# Patient Record
Sex: Female | Born: 1935 | Race: White | Hispanic: No | Smoking: Never smoker
Health system: Southern US, Community
[De-identification: ages and names within clinical notes are randomized; demographics above are authoritative.]

## PROBLEM LIST (undated history)

## (undated) DIAGNOSIS — I10 Essential (primary) hypertension: Secondary | ICD-10-CM

## (undated) DIAGNOSIS — R55 Syncope and collapse: Secondary | ICD-10-CM

## (undated) DIAGNOSIS — I341 Nonrheumatic mitral (valve) prolapse: Secondary | ICD-10-CM

## (undated) DIAGNOSIS — I340 Nonrheumatic mitral (valve) insufficiency: Secondary | ICD-10-CM

## (undated) DIAGNOSIS — E785 Hyperlipidemia, unspecified: Secondary | ICD-10-CM

## (undated) DIAGNOSIS — K279 Peptic ulcer, site unspecified, unspecified as acute or chronic, without hemorrhage or perforation: Secondary | ICD-10-CM

## (undated) HISTORY — PX: CATARACT EXTRACTION: SUR2

## (undated) HISTORY — DX: Nonrheumatic mitral (valve) insufficiency: I34.0

## (undated) HISTORY — DX: Syncope and collapse: R55

## (undated) HISTORY — PX: NECK SURGERY: SHX720

## (undated) HISTORY — DX: Hyperlipidemia, unspecified: E78.5

## (undated) HISTORY — DX: Peptic ulcer, site unspecified, unspecified as acute or chronic, without hemorrhage or perforation: K27.9

## (undated) HISTORY — PX: ELBOW SURGERY: SHX618

## (undated) HISTORY — DX: Essential (primary) hypertension: I10

## (undated) HISTORY — DX: Nonrheumatic mitral (valve) prolapse: I34.1

## (undated) HISTORY — PX: FOOT SURGERY: SHX648

---

## 1997-07-02 ENCOUNTER — Other Ambulatory Visit: Admission: RE | Admit: 1997-07-02 | Discharge: 1997-07-02 | Payer: Self-pay | Admitting: *Deleted

## 1997-08-13 ENCOUNTER — Ambulatory Visit (HOSPITAL_COMMUNITY): Admission: RE | Admit: 1997-08-13 | Discharge: 1997-08-13 | Payer: Self-pay | Admitting: Gastroenterology

## 1998-01-29 ENCOUNTER — Other Ambulatory Visit: Admission: RE | Admit: 1998-01-29 | Discharge: 1998-01-29 | Payer: Self-pay | Admitting: Obstetrics and Gynecology

## 1998-04-21 ENCOUNTER — Ambulatory Visit (HOSPITAL_COMMUNITY): Admission: RE | Admit: 1998-04-21 | Discharge: 1998-04-21 | Payer: Self-pay | Admitting: Family Medicine

## 1998-04-21 ENCOUNTER — Encounter: Payer: Self-pay | Admitting: Family Medicine

## 1998-07-13 ENCOUNTER — Other Ambulatory Visit: Admission: RE | Admit: 1998-07-13 | Discharge: 1998-07-13 | Payer: Self-pay | Admitting: Obstetrics and Gynecology

## 1999-02-01 ENCOUNTER — Other Ambulatory Visit: Admission: RE | Admit: 1999-02-01 | Discharge: 1999-02-01 | Payer: Self-pay | Admitting: Obstetrics and Gynecology

## 1999-07-16 ENCOUNTER — Other Ambulatory Visit: Admission: RE | Admit: 1999-07-16 | Discharge: 1999-07-16 | Payer: Self-pay | Admitting: Obstetrics and Gynecology

## 1999-12-27 ENCOUNTER — Encounter: Admission: RE | Admit: 1999-12-27 | Discharge: 1999-12-27 | Payer: Self-pay | Admitting: Emergency Medicine

## 1999-12-27 ENCOUNTER — Encounter: Payer: Self-pay | Admitting: Emergency Medicine

## 2000-07-31 ENCOUNTER — Other Ambulatory Visit: Admission: RE | Admit: 2000-07-31 | Discharge: 2000-07-31 | Payer: Self-pay | Admitting: Obstetrics and Gynecology

## 2001-01-25 ENCOUNTER — Encounter: Payer: Self-pay | Admitting: Emergency Medicine

## 2001-01-25 ENCOUNTER — Encounter: Admission: RE | Admit: 2001-01-25 | Discharge: 2001-01-25 | Payer: Self-pay | Admitting: Emergency Medicine

## 2001-08-09 ENCOUNTER — Other Ambulatory Visit: Admission: RE | Admit: 2001-08-09 | Discharge: 2001-08-09 | Payer: Self-pay | Admitting: Obstetrics and Gynecology

## 2002-01-02 HISTORY — PX: US ECHOCARDIOGRAPHY: HXRAD669

## 2002-03-04 ENCOUNTER — Encounter: Admission: RE | Admit: 2002-03-04 | Discharge: 2002-03-04 | Payer: Self-pay | Admitting: Emergency Medicine

## 2002-03-04 ENCOUNTER — Encounter: Payer: Self-pay | Admitting: Emergency Medicine

## 2002-10-01 ENCOUNTER — Other Ambulatory Visit: Admission: RE | Admit: 2002-10-01 | Discharge: 2002-10-01 | Payer: Self-pay | Admitting: Obstetrics and Gynecology

## 2003-01-22 ENCOUNTER — Encounter (INDEPENDENT_AMBULATORY_CARE_PROVIDER_SITE_OTHER): Payer: Self-pay | Admitting: Specialist

## 2003-01-22 ENCOUNTER — Ambulatory Visit (HOSPITAL_COMMUNITY): Admission: RE | Admit: 2003-01-22 | Discharge: 2003-01-22 | Payer: Self-pay | Admitting: Gastroenterology

## 2003-05-09 ENCOUNTER — Encounter: Admission: RE | Admit: 2003-05-09 | Discharge: 2003-05-09 | Payer: Self-pay | Admitting: Emergency Medicine

## 2003-10-14 ENCOUNTER — Other Ambulatory Visit: Admission: RE | Admit: 2003-10-14 | Discharge: 2003-10-14 | Payer: Self-pay | Admitting: Obstetrics and Gynecology

## 2003-10-17 ENCOUNTER — Ambulatory Visit (HOSPITAL_COMMUNITY): Admission: RE | Admit: 2003-10-17 | Discharge: 2003-10-17 | Payer: Self-pay | Admitting: Obstetrics and Gynecology

## 2004-05-25 ENCOUNTER — Encounter: Admission: RE | Admit: 2004-05-25 | Discharge: 2004-05-25 | Payer: Self-pay | Admitting: Emergency Medicine

## 2004-11-10 ENCOUNTER — Other Ambulatory Visit: Admission: RE | Admit: 2004-11-10 | Discharge: 2004-11-10 | Payer: Self-pay | Admitting: Obstetrics and Gynecology

## 2005-05-30 ENCOUNTER — Encounter: Admission: RE | Admit: 2005-05-30 | Discharge: 2005-05-30 | Payer: Self-pay | Admitting: Emergency Medicine

## 2006-06-12 ENCOUNTER — Encounter: Admission: RE | Admit: 2006-06-12 | Discharge: 2006-06-12 | Payer: Self-pay | Admitting: Obstetrics and Gynecology

## 2007-07-12 ENCOUNTER — Encounter: Admission: RE | Admit: 2007-07-12 | Discharge: 2007-07-12 | Payer: Self-pay | Admitting: Obstetrics and Gynecology

## 2008-08-11 ENCOUNTER — Encounter: Admission: RE | Admit: 2008-08-11 | Discharge: 2008-08-11 | Payer: Self-pay | Admitting: Obstetrics and Gynecology

## 2009-09-11 ENCOUNTER — Encounter: Admission: RE | Admit: 2009-09-11 | Discharge: 2009-09-11 | Payer: Self-pay | Admitting: Endocrinology

## 2010-02-05 ENCOUNTER — Ambulatory Visit: Payer: Self-pay | Admitting: Cardiovascular Disease

## 2010-05-02 ENCOUNTER — Encounter: Payer: Self-pay | Admitting: Endocrinology

## 2010-08-06 ENCOUNTER — Telehealth: Payer: Self-pay | Admitting: *Deleted

## 2010-08-06 ENCOUNTER — Encounter: Payer: Self-pay | Admitting: Endocrinology

## 2010-08-06 ENCOUNTER — Other Ambulatory Visit (HOSPITAL_BASED_OUTPATIENT_CLINIC_OR_DEPARTMENT_OTHER): Payer: Self-pay | Admitting: Internal Medicine

## 2010-08-06 DIAGNOSIS — Z1231 Encounter for screening mammogram for malignant neoplasm of breast: Secondary | ICD-10-CM

## 2010-08-06 NOTE — Telephone Encounter (Signed)
Pt called msg left. We received EKG with hr 48, called to see how pt was feeling both numbers called and msg left to go to er if dizzy or not feeling well and to call office on Monday if need to be seen.Alfonso Ramus RN

## 2010-08-27 NOTE — Op Note (Signed)
   NAMEELISAMA, Michelle Gardner                         ACCOUNT NO.:  [id number]   MEDICAL RECORD NO.:  [id number]                   PATIENT TYPE:  AMB   LOCATION:  ENDO                                 FACILITY:  [facility]   PHYSICIAN:  Bernette Redbird, M.D.                DATE OF BIRTH:  08-03-1924   DATE OF PROCEDURE:  01/22/2003  DATE OF DISCHARGE:                                 OPERATIVE REPORT   PROCEDURE:  Colonoscopy with polypectomy.   ENDOSCOPIST:  Bernette Redbird, M.D.   INDICATION:  Family history of colon cancer in a 77 year old female.   FINDINGS:  Diminutive rectal polyps.  Pancolonic diverticulosis.   PROCEDURE:  The nature, purpose and risks of the procedure were familiar to  the patient from prior examination and she provided written consent.  Sedation was fentanyl 50 mcg and Versed 5 mg IV, without arrhythmias or  desaturation.   The Olympus adult video colonoscope was advanced with minimal difficulty  through an angulated, slightly fixated sigmoid region, where there was some  degree of muscular thickening and associated diverticulosis, around the  colon to the orifice of the terminal ileum, which was examined but not  really entered.  The ileal mucosa looked normal at the orifice.  Pullback  was then performed.  The quality of the prep was satisfactory and it was  felt that all areas were adequately seen.   There was scattered diverticulosis throughout the colon, most prominent in  the left colon, but I believe there was also some right-sided  diverticulosis, or at least deep sulci.   In the rectum were two small polyps, the larger perhaps 4 to 5 mm across and  removed by snare technique, and adjacent to it, a small hyperplastic-  appearing sessile polyp, biopsied x1.  Retroflexion in the rectum and  reinspection of the rectosigmoid were otherwise unremarkable.   No large polyps, cancer, colitis or vascular malformations were noted.   The patient tolerated the  procedure well and there were no apparent  complications.   IMPRESSION:  1. Small rectal polyps.  2. Diverticulosis.  3. Family history of colon cancer.    PLAN:  Await pathology.  Anticipate colonoscopic followup in five years, in  view of the family history, regardless of the current polyp histology.                                               Bernette Redbird, M.D.    RB/MEDQ  D:  01/22/2003  T:  01/22/2003  Job:  [id number]   cc:   Brett Canales A. Cleta Alberts, M.D.  [address]  [place]  [place] [address]  Fax: [phone]

## 2010-09-20 ENCOUNTER — Ambulatory Visit
Admission: RE | Admit: 2010-09-20 | Discharge: 2010-09-20 | Disposition: A | Discharge status: Home / Self Care | Payer: Medicare HMO | Source: Ambulatory Visit | Attending: Internal Medicine | Admitting: Internal Medicine

## 2010-09-20 DIAGNOSIS — Z1231 Encounter for screening mammogram for malignant neoplasm of breast: Secondary | ICD-10-CM

## 2011-07-15 ENCOUNTER — Encounter: Payer: Self-pay | Admitting: *Deleted

## 2011-07-25 ENCOUNTER — Ambulatory Visit (INDEPENDENT_AMBULATORY_CARE_PROVIDER_SITE_OTHER): Payer: Medicare HMO | Admitting: Cardiovascular Disease

## 2011-07-25 ENCOUNTER — Encounter: Payer: Self-pay | Admitting: Cardiovascular Disease

## 2011-07-25 VITALS — BP 125/60 | HR 48 | Ht 67.0 in | Wt 147.8 lb

## 2011-07-25 DIAGNOSIS — I341 Nonrheumatic mitral (valve) prolapse: Secondary | ICD-10-CM

## 2011-07-25 DIAGNOSIS — I10 Essential (primary) hypertension: Secondary | ICD-10-CM

## 2011-07-25 DIAGNOSIS — I059 Rheumatic mitral valve disease, unspecified: Secondary | ICD-10-CM

## 2011-07-25 MED ORDER — TRIAMTERENE-HCTZ 37.5-25 MG PO TABS: 0.5000 | ORAL_TABLET | Freq: Every day | ORAL | Status: DC

## 2011-07-25 NOTE — Assessment & Plan Note (Signed)
Michelle Gardner's blood pressure remains mildly elevated. It may be because she's a little uptight coming into the office today. I retook her blood pressure is actually fairly normal.  She was previously on Maxzide-one half tablet a day. We'll change her  from HCTZ back to Maxzide.  LC her again in 3 months for followup office visit and basic metabolic profile

## 2011-07-25 NOTE — Patient Instructions (Signed)
Your physician recommends that you schedule a follow-up appointment in: 3 months  Your physician has recommended you make the following change in your medication:   STOP HCTZ START MAXZIDE 25 MG/37.5 MG TAKE 1/2 TABLET DAILY.

## 2011-07-25 NOTE — Assessment & Plan Note (Signed)
Stable

## 2011-07-25 NOTE — Progress Notes (Signed)
    Michelle Gardner Date of Birth  [birth year] [facility]     [place] [facility]  [address]    [address]   [address] [place], [place]  [address]    [place], [place]  [address] [phone]  Fax  [phone]  [phone]  Fax [phone]  Problem LIst: 1. Hypertension 2. Mitral valve prolapse 3. S/p radioactive iodine   History of Present Illness:  Michelle Gardner is doing well. She has a history of hypertension and mild mitral prolapse. She's been able to do all of her normal activities without any significant problems.  Current Outpatient Prescriptions  Medication Sig Dispense Refill  . aspirin 81 MG tablet Take 81 mg by mouth daily.      . Calcium Carbonate Antacid (TUMS PO) Take by mouth daily.      . IBUPROFEN PO Take by mouth as needed.      . Multiple Vitamin (MULTIVITAMIN) capsule Take 1 capsule by mouth daily.      [name] triamterene-hydrochlorothiazide (MAXZIDE-25) 37.5-25 MG per tablet Take 0.5 each (0.5 tablets total) by mouth daily.  30 tablet  5     Allergies  Allergen Reactions  . Codeine     Past Medical History  Diagnosis Date  . HTN (hypertension)   . MVP (mitral valve prolapse)   . Mitral regurgitation   . Peptic ulcer     Past Surgical History  Procedure Date  . Neck surgery   . Elbow surgery     Wired  . Foot surgery     Cyst  . US echocardiography 01-02-2002    EF 65-70%    History  Smoking status  . Never Smoker   Smokeless tobacco  . Not on file    History  Alcohol Use: Not on file    No family history on file.  Reviw of Systems:  Reviewed in the HPI.  All other systems are negative.  Physical Exam: Blood pressure 157/78, pulse 48, height 5\' 7"  (1.702 m), weight 147 lb 12.8 oz (67.042 kg). General: Well developed, well nourished, in no acute distress.  Head: Normocephalic, atraumatic, sclera non-icteric, mucus membranes are moist,   Neck: Supple. Carotids are 2 + without bruits. No JVD  Lungs: Clear  bilaterally to auscultation.  Heart: regular rate.  normal  S1 S2. There is a soft systolic murmur at the LSB.  Abdomen: Soft, non-tender, non-distended with normal bowel sounds. No hepatomegaly. No rebound/guarding. No masses.  Msk:  Strength and tone are normal  Extremities: No clubbing or cyanosis. No edema.  Distal pedal pulses are 2+ and equal bilaterally.  Neuro: Alert and oriented X 3. Moves all extremities spontaneously.  Psych:  Responds to questions appropriately with a normal affect.  ECG:  Assessment / Plan:

## 2011-09-28 ENCOUNTER — Other Ambulatory Visit: Payer: Self-pay | Admitting: Internal Medicine

## 2011-09-28 DIAGNOSIS — Z1231 Encounter for screening mammogram for malignant neoplasm of breast: Secondary | ICD-10-CM

## 2011-10-18 ENCOUNTER — Ambulatory Visit: Payer: MEDICARE

## 2011-10-19 ENCOUNTER — Ambulatory Visit
Admission: RE | Admit: 2011-10-19 | Discharge: 2011-10-19 | Disposition: A | Discharge status: Home / Self Care | Payer: Medicare Other | Source: Ambulatory Visit | Attending: Internal Medicine | Admitting: Internal Medicine

## 2011-10-19 DIAGNOSIS — Z1231 Encounter for screening mammogram for malignant neoplasm of breast: Secondary | ICD-10-CM

## 2011-10-26 ENCOUNTER — Encounter: Payer: Self-pay | Admitting: Cardiovascular Disease

## 2011-10-26 ENCOUNTER — Ambulatory Visit (INDEPENDENT_AMBULATORY_CARE_PROVIDER_SITE_OTHER): Payer: Medicare Other | Admitting: Cardiovascular Disease

## 2011-10-26 VITALS — BP 140/70 | HR 49 | Ht 67.0 in | Wt 145.8 lb

## 2011-10-26 DIAGNOSIS — I10 Essential (primary) hypertension: Secondary | ICD-10-CM

## 2011-10-26 LAB — BASIC METABOLIC PANEL
BUN: 14 mg/dL (ref 6–23)
Chloride: 100 mEq/L (ref 96–112)
GFR: 60.09 mL/min (ref >60.00)
Glucose, Bld: 113 mg/dL — ABNORMAL HIGH (ref 70–99)
Potassium: 3.2 mEq/L — ABNORMAL LOW (ref 3.5–5.1)
Sodium: 139 mEq/L (ref 135–145)

## 2011-10-26 NOTE — Progress Notes (Signed)
    Michelle Gardner Date of Birth  [birth year] [facility]     [place] Office  [address]    [address]   [address] [place], [place]  [address]    [place], [place]  [address] [phone]  Fax  [phone]  [phone]  Fax [phone]  Problem LIst: 1. Hypertension 2. Mitral valve prolapse 3. S/p radioactive iodine  4. Sinus Bradycardia - chronic  History of Present Illness:  [name] is doing well. She has a history of hypertension and mild mitral prolapse. She's been able to do all of her normal activities without any significant problems.  Current Outpatient Prescriptions  Medication Sig Dispense Refill  . aspirin 81 MG tablet Take 81 mg by mouth daily.      . Calcium Carbonate Antacid (TUMS PO) Take by mouth daily.      . IBUPROFEN PO Take by mouth as needed.      . Multiple Vitamin (MULTIVITAMIN) capsule Take 1 capsule by mouth daily.      [name] triamterene-hydrochlorothiazide (MAXZIDE-25) 37.5-25 MG per tablet Take 0.5 each (0.5 tablets total) by mouth daily.  30 tablet  5     Allergies  Allergen Reactions  . Codeine     Past Medical History  Diagnosis Date  . HTN (hypertension)   . MVP (mitral valve prolapse)   . Mitral regurgitation   . Peptic ulcer     Past Surgical History  Procedure Date  . Neck surgery   . Elbow surgery     Wired  . Foot surgery     Cyst  . US echocardiography 01-02-2002    EF 65-70%    History  Smoking status  . Never Smoker   Smokeless tobacco  . Not on file    History  Alcohol Use: Not on file    No family history on file.  Reviw of Systems:  Reviewed in the HPI.  All other systems are negative.  Physical Exam: Blood pressure 140/70, pulse 49, height 5\' 7"  (1.702 m), weight 145 lb 12.8 oz (66.134 kg). General: Well developed, well nourished, in no acute distress.  Head: Normocephalic, atraumatic, sclera non-icteric, mucus membranes are moist,   Neck: Supple. Carotids are 2 + without bruits.  No JVD  Lungs: Clear bilaterally to auscultation.  Heart: regular rate.  normal  S1 S2. There is a soft systolic murmur at the LSB.  Abdomen: Soft, non-tender, non-distended with normal bowel sounds. No hepatomegaly. No rebound/guarding. No masses.  Msk:  Strength and tone are normal  Extremities: No clubbing or cyanosis. No edema.  Distal pedal pulses are 2+ and equal bilaterally.  Neuro: Alert and oriented X 3. Moves all extremities spontaneously.  Psych:  Responds to questions appropriately with a normal affect.  ECG: 10/26/2011 Sinus bradycardia with a first-degree AV block. Her heart rate is 49. She has nonspecific ST and T wave changes. Assessment / Plan:

## 2011-10-26 NOTE — Patient Instructions (Signed)
Your physician recommends that you return for lab today, bmet drawn due to med changes from last office visit.  Your physician wants you to follow-up in: 6 months You will receive a reminder letter in the mail two months in advance. If you don't receive a letter, please call our office to schedule the follow-up appointment.

## 2011-10-26 NOTE — Assessment & Plan Note (Signed)
Michelle Gardner is doing fairly well. Her blood pressure is well-controlled.  She's quite bradycardic but she's not on any medications to cause bradycardia. She's not had any episodes of syncope or presyncope. I told her that if she experiences any presyncope to call me right away. She may need a pacemaker at some point.  I'll see her again in 6 months for followup office visit.

## 2011-10-27 ENCOUNTER — Telehealth: Payer: Self-pay | Admitting: *Deleted

## 2011-10-27 DIAGNOSIS — E876 Hypokalemia: Secondary | ICD-10-CM

## 2011-10-27 MED ORDER — POTASSIUM CHLORIDE CRYS ER 10 MEQ PO TBCR: 10.0000 meq | EXTENDED_RELEASE_TABLET | Freq: Two times a day (BID) | ORAL | Status: DC

## 2011-10-27 NOTE — Telephone Encounter (Signed)
kdur added to mar pt will start today/ 1 month lab draw date set. Pt verbalized understanding of lab work/K+.

## 2011-10-27 NOTE — Telephone Encounter (Signed)
Message copied by Antony Odea on Thu Oct 27, 2011  9:46 AM ------      Message from: Vesta Mixer      Created: Wed Oct 26, 2011  6:43 PM       She is on a potassium sparing diuretic.  I would prefer that she increase her potassium in her diet.  If she is already eating a fair amount of potassium, add Kdur 10 qd.  Recheck in 1 month.

## 2011-11-23 ENCOUNTER — Other Ambulatory Visit: Payer: Medicare Other

## 2012-05-21 ENCOUNTER — Other Ambulatory Visit: Payer: Self-pay | Admitting: *Deleted

## 2012-05-21 DIAGNOSIS — I10 Essential (primary) hypertension: Secondary | ICD-10-CM

## 2012-05-21 NOTE — Telephone Encounter (Signed)
Opened in Error.

## 2012-05-22 ENCOUNTER — Other Ambulatory Visit: Payer: Self-pay | Admitting: *Deleted

## 2012-05-22 DIAGNOSIS — I10 Essential (primary) hypertension: Secondary | ICD-10-CM

## 2012-05-22 MED ORDER — TRIAMTERENE-HCTZ 37.5-25 MG PO TABS: 0.5000 | ORAL_TABLET | Freq: Every day | ORAL | Status: DC

## 2012-05-22 NOTE — Telephone Encounter (Signed)
Last seen in office 10-2011 with labs. Fax Received. Refill Completed. Nacole Fluhr Chowoe (R.M.A)

## 2012-07-11 ENCOUNTER — Other Ambulatory Visit: Payer: Self-pay | Admitting: *Deleted

## 2012-07-11 MED ORDER — POTASSIUM CHLORIDE CRYS ER 10 MEQ PO TBCR: 10.0000 meq | EXTENDED_RELEASE_TABLET | Freq: Two times a day (BID) | ORAL | Status: DC

## 2012-07-11 NOTE — Telephone Encounter (Signed)
NEED APPOINTMENT Fax Received. Refill Completed. Abrial Arrighi Chowoe (R.M.A)   

## 2012-11-27 ENCOUNTER — Other Ambulatory Visit: Payer: Self-pay

## 2012-11-27 DIAGNOSIS — Z1231 Encounter for screening mammogram for malignant neoplasm of breast: Secondary | ICD-10-CM

## 2013-01-02 ENCOUNTER — Ambulatory Visit
Admission: RE | Admit: 2013-01-02 | Discharge: 2013-01-02 | Disposition: A | Discharge status: Home / Self Care | Payer: Medicare Other | Source: Ambulatory Visit

## 2013-01-02 DIAGNOSIS — Z1231 Encounter for screening mammogram for malignant neoplasm of breast: Secondary | ICD-10-CM

## 2013-01-08 ENCOUNTER — Other Ambulatory Visit: Payer: Self-pay | Admitting: *Deleted

## 2013-01-14 ENCOUNTER — Other Ambulatory Visit: Payer: Self-pay | Admitting: *Deleted

## 2013-01-14 ENCOUNTER — Other Ambulatory Visit: Payer: Self-pay | Admitting: Endocrinology

## 2013-01-14 ENCOUNTER — Other Ambulatory Visit: Payer: Medicare Other

## 2013-01-14 DIAGNOSIS — E039 Hypothyroidism, unspecified: Secondary | ICD-10-CM

## 2013-01-14 DIAGNOSIS — E042 Nontoxic multinodular goiter: Secondary | ICD-10-CM | POA: Insufficient documentation

## 2013-01-14 LAB — TSH: TSH: 2.405 u[IU]/mL (ref 0.350–4.500)

## 2013-01-16 ENCOUNTER — Encounter: Payer: Self-pay | Admitting: Endocrinology

## 2013-01-16 ENCOUNTER — Ambulatory Visit (INDEPENDENT_AMBULATORY_CARE_PROVIDER_SITE_OTHER): Payer: Medicare Other | Admitting: Endocrinology

## 2013-01-16 VITALS — BP 140/70 | HR 60 | Temp 98.5°F | Resp 12 | Ht 67.0 in | Wt 147.8 lb

## 2013-01-16 DIAGNOSIS — E042 Nontoxic multinodular goiter: Secondary | ICD-10-CM

## 2013-01-16 NOTE — Progress Notes (Addendum)
Patient ID: SAHAANA WEITMAN, female   DOB: 10/14/1924, 79 y.o.   MRN: [id number]    Reason for Appointment: Goiter, followup    History of Present Illness:   The patient's thyroid enlargement was first discovered in 2005 She had subclinical hyperthyroidism associated with this and was treated with 29 mCi of I-131 Subsequently her goiter has been smaller in size and she has had normal thyroid functions  She has had no difficulty with swallowing. No choking sensation in her neck or pressure in any position or when lying down.  Lab Results  Component Value Date   TSH 2.405 01/14/2013   Orders Only on 01/14/2013  Component Date Value Range Status  . TSH 01/14/2013 2.405  0.350 - 4.500 uIU/mL Final  . Free T4 01/14/2013 1.03  0.80 - 1.80 ng/dL Final   HYPERCALCEMIA: Her highest calcium has been 10.7 associated with a PTH level of 31, normal bone density and no history of kidney stones She is taking vitamin D3 1000 units, has baseline vitamin D level of 29. Also taking calcium plus vitamin D supplement Last calcium was 10.2  HYPERTENSION: Mild and well controlled with Maxzide half tablet      Medication List       This list is accurate as of: 01/16/13 11:37 AM.  Always use your most recent med list.               aspirin 81 MG tablet  Take 81 mg by mouth daily.     IBUPROFEN PO  Take by mouth as needed.     multivitamin capsule  Take 1 capsule by mouth daily.     potassium chloride 10 MEQ tablet  Commonly known as:  K-DUR,KLOR-CON  Take 1 tablet (10 mEq total) by mouth 2 (two) times daily.     triamterene-hydrochlorothiazide 37.5-25 MG per tablet  Commonly known as:  MAXZIDE-25  Take 0.5 each (0.5 tablets total) by mouth daily.     TUMS PO  Take by mouth daily.        Allergies:  Allergies  Allergen Reactions  . Codeine     Past Medical History  Diagnosis Date  . HTN (hypertension)   . MVP (mitral valve prolapse)   . Mitral regurgitation   . Peptic  ulcer     Past Surgical History  Procedure Laterality Date  . Neck surgery    . Elbow surgery      Wired  . Foot surgery      Cyst  . US echocardiography  01-02-2002    EF 65-70%    No family history on file.  Social History:  reports that she has never smoked. She does not have any smokeless tobacco history on file. Her alcohol and drug histories are not on file.   Review of Systems:      ENDOCRINOLOGY:  no history of Diabetes.             Examination:   BP 140/70  Pulse 60  Temp(Src) 98.5 F (36.9 C)  Resp 12  Ht 5\' 7"  (1.702 m)  Wt 147 lb 12.8 oz (67.042 kg)  BMI 23.14 kg/m2  SpO2 96%   General Appearance: pleasant, average built and nourished          Eyes: No abnormal prominence or eyelid swelling.          Neck: The thyroid is nonpalpable Neurological: REFLEXES: at biceps are normal.        Assessment/Plan:  Multinodular  goiter, this appears to be clinically much smaller than at initial diagnosis and she continues to be euthyroid Will need annual followup  HYPERCALCEMIA likely to be from mild hyperparathyroidism, had a normal level on her last visit in April. She has had lab work from PCP in August, will get reports  Will monitor her calcium periodically, to continue vitamin D supplement  Legna Mausolf 01/09/2013, 11:25 AM

## 2013-07-15 ENCOUNTER — Other Ambulatory Visit: Payer: Medicare Other

## 2013-07-17 ENCOUNTER — Ambulatory Visit: Payer: Medicare Other | Admitting: Endocrinology

## 2013-07-30 ENCOUNTER — Other Ambulatory Visit (INDEPENDENT_AMBULATORY_CARE_PROVIDER_SITE_OTHER): Payer: Medicare HMO

## 2013-07-30 DIAGNOSIS — E039 Hypothyroidism, unspecified: Secondary | ICD-10-CM

## 2013-07-30 LAB — T4, FREE: Free T4: 0.77 ng/dL (ref 0.60–1.60)

## 2013-07-30 LAB — BASIC METABOLIC PANEL
BUN: 13 mg/dL (ref 6–23)
CO2: 31 mEq/L (ref 19–32)
CREATININE: 1 mg/dL (ref 0.4–1.2)
Calcium: 9.8 mg/dL (ref 8.4–10.5)
Chloride: 103 mEq/L (ref 96–112)
GFR: 57.71 mL/min — AB (ref >60.00)
GLUCOSE: 92 mg/dL (ref 70–99)
POTASSIUM: 3.7 meq/L (ref 3.5–5.1)
Sodium: 139 mEq/L (ref 135–145)

## 2013-07-30 LAB — TSH: TSH: 1.96 u[IU]/mL (ref 0.35–5.50)

## 2013-08-02 ENCOUNTER — Ambulatory Visit (INDEPENDENT_AMBULATORY_CARE_PROVIDER_SITE_OTHER): Payer: Medicare HMO | Admitting: Endocrinology

## 2013-08-02 ENCOUNTER — Encounter: Payer: Self-pay | Admitting: Endocrinology

## 2013-08-02 VITALS — BP 132/70 | HR 51 | Temp 98.1°F | Resp 14 | Ht 68.0 in | Wt 155.0 lb

## 2013-08-02 DIAGNOSIS — E042 Nontoxic multinodular goiter: Secondary | ICD-10-CM

## 2013-08-02 NOTE — Progress Notes (Signed)
Patient ID: Michelle Gardner, female   DOB: [birth year], 80 y.o.   MRN: [id number]    Reason for Appointment: Goiter, followup    History of Present Illness:   The patient's thyroid enlargement was first discovered in 2005 She had subclinical hyperthyroidism associated with this and was treated with 29 mCi of I-131 Subsequently her goiter has been smaller in size and she has had normal thyroid functions  She has had no difficulty with swallowing. No choking sensation in her neck or pressure in any position or when lying down.  Lab Results  Component Value Date   TSH 1.96 07/30/2013    HYPERCALCEMIA: Her highest calcium has been 10.7 associated with a PTH level of 31, normal bone density and no history of kidney stones She is taking vitamin D3 1000 units, has baseline vitamin D level of 29. Also taking calcium plus vitamin D supplement Last calcium was 10.0 in 8/14  HYPERTENSION: Mild and well controlled with Maxzide half tablet  LABS:  Appointment on 07/30/2013  Component Date Value Ref Range Status  . TSH 07/30/2013 1.96  0.35 - 5.50 uIU/mL Final  . Free T4 07/30/2013 0.77  0.60 - 1.60 ng/dL Final  . Sodium 07/30/2013 139  135 - 145 mEq/L Final  . Potassium 07/30/2013 3.7  3.5 - 5.1 mEq/L Final  . Chloride 07/30/2013 103  96 - 112 mEq/L Final  . CO2 07/30/2013 31  19 - 32 mEq/L Final  . Glucose, Bld 07/30/2013 92  70 - 99 mg/dL Final  . BUN 07/30/2013 13  6 - 23 mg/dL Final  . Creatinine, Ser 07/30/2013 1.0  0.4 - 1.2 mg/dL Final  . Calcium 07/30/2013 9.8  8.4 - 10.5 mg/dL Final  . GFR 07/30/2013 57.71* >60.00 mL/min Final        Medication List       This list is accurate as of: 08/02/13  1:09 PM.  Always use your most recent med list.               amLODipine 2.5 MG tablet  Commonly known as:  NORVASC     aspirin 81 MG tablet  Take 81 mg by mouth daily.     IBUPROFEN PO  Take by mouth as needed.     multivitamin capsule  Take 1 capsule by mouth daily.      potassium chloride 10 MEQ tablet  Commonly known as:  K-DUR,KLOR-CON  Take 1 tablet (10 mEq total) by mouth 2 (two) times daily.     potassium chloride 10 MEQ tablet  Commonly known as:  K-DUR     triamterene-hydrochlorothiazide 37.5-25 MG per tablet  Commonly known as:  MAXZIDE-25  Take 0.5 each (0.5 tablets total) by mouth daily.     TUMS PO  Take by mouth daily.        Allergies:  Allergies  Allergen Reactions  . Codeine     Past Medical History  Diagnosis Date  . HTN (hypertension)   . MVP (mitral valve prolapse)   . Mitral regurgitation   . Peptic ulcer     Past Surgical History  Procedure Laterality Date  . Neck surgery    . Elbow surgery      Wired  . Foot surgery      Cyst  . US echocardiography  01-02-2002    EF 65-70%    No family history on file.  Social History:  reports that she has never smoked. She does not have any smokeless  tobacco history on file. Her alcohol and drug histories are not on file.   Review of Systems:      ENDOCRINOLOGY:  no history of Diabetes.      Wt Readings from Last 3 Encounters:  08/02/13 155 lb (70.308 kg)  01/16/13 147 lb 12.8 oz (67.042 kg)  10/26/11 145 lb 12.8 oz (66.134 kg)           Examination:   BP 132/70  Pulse 51  Temp(Src) 98.1 F (36.7 C)  Resp 14  Ht 5\' 8"  (1.727 m)  Wt 155 lb (70.308 kg)  BMI 23.57 kg/m2  SpO2 97%   General Appearance: pleasant, average built and nourished          Eyes: No abnormal prominence or eyelid swelling.          Neck: The thyroid is not palpable Neurological: REFLEXES: at biceps are normal.        Assessment/Plan:  Multinodular goiter, this is now not palpable compared to when it was found on examination at diagnosis and she continues to be euthyroid with normal TSH No recurrence of subclinical hyperthyroidism Will need annual followup  HYPERCALCEMIA likely to be from mild hyperparathyroidism. She has not had any history of posterior anemia or  osteoporosis The calcium is now 9.8 which is improved and no further evaluation needed  Will monitor her calcium anually, to continue vitamin D supplement  Michelle Gardner 08/02/2013 1:09 PM

## 2013-09-04 ENCOUNTER — Other Ambulatory Visit: Payer: Self-pay | Admitting: Dermatology

## 2013-12-18 ENCOUNTER — Other Ambulatory Visit: Payer: Self-pay

## 2013-12-18 DIAGNOSIS — Z1231 Encounter for screening mammogram for malignant neoplasm of breast: Secondary | ICD-10-CM

## 2014-01-06 ENCOUNTER — Ambulatory Visit
Admission: RE | Admit: 2014-01-06 | Discharge: 2014-01-06 | Disposition: A | Discharge status: Home / Self Care | Payer: Medicare HMO | Source: Ambulatory Visit

## 2014-01-06 DIAGNOSIS — Z1231 Encounter for screening mammogram for malignant neoplasm of breast: Secondary | ICD-10-CM

## 2014-07-30 ENCOUNTER — Other Ambulatory Visit (INDEPENDENT_AMBULATORY_CARE_PROVIDER_SITE_OTHER): Payer: Medicare HMO

## 2014-07-30 DIAGNOSIS — E042 Nontoxic multinodular goiter: Secondary | ICD-10-CM

## 2014-07-30 LAB — RENAL FUNCTION PANEL
Albumin: 3.7 g/dL (ref 3.5–5.2)
BUN: 16 mg/dL (ref 6–23)
CO2: 30 mEq/L (ref 19–32)
Calcium: 10 mg/dL (ref 8.4–10.5)
Chloride: 104 mEq/L (ref 96–112)
Creatinine, Ser: 0.97 mg/dL (ref 0.40–1.20)
GFR: 57.57 mL/min — ABNORMAL LOW (ref >60.00)
Glucose, Bld: 102 mg/dL — ABNORMAL HIGH (ref 70–99)
Phosphorus: 2.3 mg/dL (ref 2.3–4.6)
Potassium: 3.6 mEq/L (ref 3.5–5.1)
Sodium: 138 mEq/L (ref 135–145)

## 2014-07-30 LAB — T4, FREE: Free T4: 0.99 ng/dL (ref 0.60–1.60)

## 2014-07-30 LAB — TSH: TSH: 3.38 u[IU]/mL (ref 0.35–4.50)

## 2014-08-04 ENCOUNTER — Encounter: Payer: Self-pay | Admitting: Endocrinology

## 2014-08-04 ENCOUNTER — Ambulatory Visit (INDEPENDENT_AMBULATORY_CARE_PROVIDER_SITE_OTHER): Payer: Medicare HMO | Admitting: Endocrinology

## 2014-08-04 VITALS — BP 142/66 | HR 65 | Temp 97.7°F | Resp 14 | Ht 68.0 in | Wt 138.6 lb

## 2014-08-04 DIAGNOSIS — E042 Nontoxic multinodular goiter: Secondary | ICD-10-CM

## 2014-08-04 NOTE — Progress Notes (Signed)
Patient ID: Michelle Gardner, female   DOB: 1924/07/09, 81 y.o.   MRN: [id number]    Reason for Appointment: Goiter, followup    History of Present Illness:   The patient's thyroid enlargement was first discovered in 2005 She had subclinical hyperthyroidism associated with this and was treated with 29 mCi of I-131 Subsequently her goiter has been smaller in size and she has had consistently normal thyroid functions  She has had no difficulty with swallowing. No choking sensation in her neck   Thyroid levels as follows:  Lab Results  Component Value Date   TSH 3.38 07/30/2014   TSH 1.96 07/30/2013   TSH 2.405 01/14/2013   FREET4 0.99 07/30/2014   FREET4 0.77 07/30/2013   FREET4 1.03 01/14/2013     HYPERCALCEMIA: Her highest calcium has been 10.7 associated with a PTH level of 31, normal bone density and no history of kidney stones She is taking vitamin D3 1000 units, has baseline vitamin D level of 29. Also taking calcium plus vitamin D supplement Calcium level is not 10.0  HYPERTENSION: Mild and well controlled with Maxzide half tablet and Norvasc  LABS:  Lab on 07/30/2014  Component Date Value Ref Range Status  . Free T4 07/30/2014 0.99  0.60 - 1.60 ng/dL Final  . Sodium 07/30/2014 138  135 - 145 mEq/L Final  . Potassium 07/30/2014 3.6  3.5 - 5.1 mEq/L Final  . Chloride 07/30/2014 104  96 - 112 mEq/L Final  . CO2 07/30/2014 30  19 - 32 mEq/L Final  . Calcium 07/30/2014 10.0  8.4 - 10.5 mg/dL Final  . Albumin 07/30/2014 3.7  3.5 - 5.2 g/dL Final  . BUN 07/30/2014 16  6 - 23 mg/dL Final  . Creatinine, Ser 07/30/2014 0.97  0.40 - 1.20 mg/dL Final  . Glucose, Bld 07/30/2014 102* 70 - 99 mg/dL Final  . Phosphorus 07/30/2014 2.3  2.3 - 4.6 mg/dL Final  . GFR 07/30/2014 57.57* >60.00 mL/min Final  . TSH 07/30/2014 3.38  0.35 - 4.50 uIU/mL Final        Medication List       This list is accurate as of: 08/04/14  1:09 PM.  Always use your most recent med list.                amLODipine 2.5 MG tablet  Commonly known as:  NORVASC     aspirin 81 MG tablet  Take 81 mg by mouth daily.     IBUPROFEN PO  Take by mouth as needed.     multivitamin capsule  Take 1 capsule by mouth daily.     potassium chloride 10 MEQ tablet  Commonly known as:  K-DUR,KLOR-CON  Take 1 tablet (10 mEq total) by mouth 2 (two) times daily.     potassium chloride 10 MEQ tablet  Commonly known as:  K-DUR     triamterene-hydrochlorothiazide 37.5-25 MG per tablet  Commonly known as:  MAXZIDE-25  Take 0.5 each (0.5 tablets total) by mouth daily.     TUMS PO  Take by mouth daily.        Allergies:  Allergies  Allergen Reactions  . Codeine     Past Medical History  Diagnosis Date  . HTN (hypertension)   . MVP (mitral valve prolapse)   . Mitral regurgitation   . Peptic ulcer     Past Surgical History  Procedure Laterality Date  . Neck surgery    . Elbow surgery  Wired  . Foot surgery      Cyst  . US echocardiography  01-02-2002    EF 65-70%    No family history on file.  Social History:  reports that she has never smoked. She does not have any smokeless tobacco history on file. Her alcohol and drug histories are not on file.   Review of Systems:      ENDOCRINOLOGY:  no history of Diabetes.  Weight history:       Wt Readings from Last 3 Encounters:  08/04/14 138 lb 9.6 oz (62.869 kg)  08/02/13 155 lb (70.308 kg)  01/16/13 147 lb 12.8 oz (67.042 kg)          Examination:   BP 142/66 mmHg  Pulse 65  Temp(Src) 97.7 F (36.5 C)  Resp 14  Ht 5\' 8"  (1.727 m)  Wt 138 lb 9.6 oz (62.869 kg)  BMI 21.08 kg/m2  SpO2 96%   General Appearance:  looks well, small build         Eyes: No abnormal prominence or eyelid swelling.          Neck: The thyroid is not palpable on either side even on swallowing:  REFLEXES: at biceps are normal.        Assessment/Plan:  Multinodular goiter, this is again not palpable; she did have a palpable goiter  initially at diagnosis No recurrence of subclinical hyperthyroidism which she had a few years ago and is completely euthyroid at this time Will continue annual followup  HYPERCALCEMIA and probable mild hyperparathyroidism: Since her calcium level is consistently normal now will not need any further evaluation.  Will monitor her calcium anually, to continue vitamin D supplement  Will defer any assessment of bone density to PCP  Weight loss: Advised her to monitor her weight periodically and if she is continuing to lose weight will discuss with her PCP  [facility] 08/04/2014 1:09 PM

## 2014-10-14 ENCOUNTER — Other Ambulatory Visit: Payer: Self-pay | Admitting: Obstetrics and Gynecology

## 2014-10-15 LAB — CYTOLOGY - PAP

## 2014-12-10 ENCOUNTER — Other Ambulatory Visit: Payer: Self-pay

## 2014-12-10 DIAGNOSIS — Z1231 Encounter for screening mammogram for malignant neoplasm of breast: Secondary | ICD-10-CM

## 2015-01-09 ENCOUNTER — Ambulatory Visit
Admission: RE | Admit: 2015-01-09 | Discharge: 2015-01-09 | Disposition: A | Discharge status: Home / Self Care | Payer: Medicare HMO | Source: Ambulatory Visit

## 2015-01-09 DIAGNOSIS — Z1231 Encounter for screening mammogram for malignant neoplasm of breast: Secondary | ICD-10-CM

## 2015-05-26 DIAGNOSIS — R69 Illness, unspecified: Secondary | ICD-10-CM | POA: Diagnosis not present

## 2015-06-09 DIAGNOSIS — N183 Chronic kidney disease, stage 3 (moderate): Secondary | ICD-10-CM | POA: Diagnosis not present

## 2015-06-09 DIAGNOSIS — I10 Essential (primary) hypertension: Secondary | ICD-10-CM | POA: Diagnosis not present

## 2015-06-09 DIAGNOSIS — Z6822 Body mass index (BMI) 22.0-22.9, adult: Secondary | ICD-10-CM | POA: Diagnosis not present

## 2015-06-09 DIAGNOSIS — R634 Abnormal weight loss: Secondary | ICD-10-CM | POA: Diagnosis not present

## 2015-07-03 DIAGNOSIS — Z6822 Body mass index (BMI) 22.0-22.9, adult: Secondary | ICD-10-CM | POA: Diagnosis not present

## 2015-07-03 DIAGNOSIS — M12832 Other specific arthropathies, not elsewhere classified, left wrist: Secondary | ICD-10-CM | POA: Diagnosis not present

## 2015-07-12 ENCOUNTER — Emergency Department (HOSPITAL_COMMUNITY)
Admission: EM | Admit: 2015-07-12 | Discharge: 2015-07-12 | Disposition: A | Discharge status: Home / Self Care | Payer: Medicare HMO | Attending: Emergency Medicine | Admitting: Emergency Medicine

## 2015-07-12 ENCOUNTER — Encounter (HOSPITAL_COMMUNITY): Payer: Self-pay | Admitting: *Deleted

## 2015-07-12 DIAGNOSIS — R55 Syncope and collapse: Secondary | ICD-10-CM

## 2015-07-12 DIAGNOSIS — Z79899 Other long term (current) drug therapy: Secondary | ICD-10-CM | POA: Insufficient documentation

## 2015-07-12 DIAGNOSIS — Z8711 Personal history of peptic ulcer disease: Secondary | ICD-10-CM | POA: Diagnosis not present

## 2015-07-12 DIAGNOSIS — Z7982 Long term (current) use of aspirin: Secondary | ICD-10-CM | POA: Diagnosis not present

## 2015-07-12 DIAGNOSIS — I10 Essential (primary) hypertension: Secondary | ICD-10-CM

## 2015-07-12 LAB — URINALYSIS, ROUTINE W REFLEX MICROSCOPIC
Bilirubin Urine: NEGATIVE
Glucose, UA: >1000 mg/dL — AB
HGB URINE DIPSTICK: NEGATIVE
Ketones, ur: NEGATIVE mg/dL
LEUKOCYTES UA: NEGATIVE
Nitrite: NEGATIVE
Protein, ur: NEGATIVE mg/dL
SPECIFIC GRAVITY, URINE: 1.012 (ref 1.005–1.030)
pH: 7 (ref 5.0–8.0)

## 2015-07-12 LAB — BASIC METABOLIC PANEL
ANION GAP: 11 (ref 5–15)
BUN: 13 mg/dL (ref 6–20)
CALCIUM: 10.3 mg/dL (ref 8.9–10.3)
CHLORIDE: 99 mmol/L — AB (ref 101–111)
CO2: 27 mmol/L (ref 22–32)
Creatinine, Ser: 0.92 mg/dL (ref 0.44–1.00)
GFR calc Af Amer: >60 mL/min (ref >60)
GFR calc non Af Amer: 54 mL/min — ABNORMAL LOW (ref >60)
GLUCOSE: 125 mg/dL — AB (ref 65–99)
POTASSIUM: 4 mmol/L (ref 3.5–5.1)
Sodium: 137 mmol/L (ref 135–145)

## 2015-07-12 LAB — CBC
HCT: 41.2 % (ref 36.0–46.0)
HEMOGLOBIN: 13.8 g/dL (ref 12.0–15.0)
MCH: 31.4 pg (ref 26.0–34.0)
MCHC: 33.5 g/dL (ref 30.0–36.0)
MCV: 93.8 fL (ref 78.0–100.0)
Platelets: 282 10*3/uL (ref 150–400)
RBC: 4.39 MIL/uL (ref 3.87–5.11)
RDW: 13 % (ref 11.5–15.5)
WBC: 6.8 10*3/uL (ref 4.0–10.5)

## 2015-07-12 LAB — URINE MICROSCOPIC-ADD ON
BACTERIA UA: NONE SEEN
RBC / HPF: NONE SEEN RBC/hpf (ref 0–5)
WBC, UA: NONE SEEN WBC/hpf (ref 0–5)

## 2015-07-12 MED ORDER — AMLODIPINE BESYLATE 5 MG PO TABS
2.5000 mg | ORAL_TABLET | Freq: Once | ORAL | Status: AC
  Administered 2015-07-12: 2.5 mg via ORAL
  Filled 2015-07-12: qty 1

## 2015-07-12 NOTE — ED Notes (Signed)
Pt reports having elevated BP at home today before church BP 180/70, pt went to church & a church member seen her place her hand on her head & sit down, pt states, "i felt like I was going to pass out." pt denies CP, SOB, n/v/d, denies HA & dizziness, pt denies current symptoms, A&O x4

## 2015-07-12 NOTE — ED Provider Notes (Signed)
CSN: [id number]     Arrival date & time 07/12/15  1300 History   First MD Initiated Contact with Patient 07/12/15 1716     Chief Complaint  Patient presents with  . Hypertension  . Near Syncope     (Consider location/radiation/quality/duration/timing/severity/associated sxs/prior Treatment) Patient is a 82 y.o. female presenting with hypertension and near-syncope. The history is provided by the patient.  Hypertension Pertinent negatives include no chest pain, no abdominal pain, no headaches and no shortness of breath.  Near Syncope Pertinent negatives include no chest pain, no abdominal pain, no headaches and no shortness of breath.  Patient indicates this AM was concerned as her home bp med read 180/70.  She went to church, and at one point felt faint, lightheaded.  Lasted a couple minutes. No current faintness or dizziness. During episode, no chest pain or discomfort. No sob or unusual doe. No palpitations or sense of either rapid or irregular heartbeat. Had eaten normally today. Has not taken her norvasc yet today, usually takes at 3 pm.  Denies recent blood loss, no melena. No cough or uri c/o. No abd pain. No nvd. No gu c/o. No fever or chills.       Past Medical History  Diagnosis Date  . HTN (hypertension)   . MVP (mitral valve prolapse)   . Mitral regurgitation   . Peptic ulcer    Past Surgical History  Procedure Laterality Date  . Neck surgery    . Elbow surgery      Wired  . Foot surgery      Cyst  . US echocardiography  01-02-2002    EF 65-70%   No family history on file. Social History  Substance Use Topics  . Smoking status: Never Smoker   . Smokeless tobacco: None  . Alcohol Use: No   OB History    No data available     Review of Systems  Constitutional: Negative for fever and chills.  HENT: Negative for sore throat.   Eyes: Negative for visual disturbance.  Respiratory: Negative for cough and shortness of breath.   Cardiovascular: Positive for  near-syncope. Negative for chest pain and palpitations.  Gastrointestinal: Negative for vomiting, abdominal pain, diarrhea and blood in stool.  Genitourinary: Negative for dysuria.  Musculoskeletal: Negative for back pain and neck pain.  Skin: Negative for rash.  Neurological: Negative for speech difficulty, weakness, numbness and headaches.  Hematological: Does not bruise/bleed easily.  Psychiatric/Behavioral: Negative for confusion.      Allergies  Codeine  Home Medications   Prior to Admission medications   Medication Sig Start Date End Date Taking? Authorizing Provider  amLODipine (NORVASC) 2.5 MG tablet  05/30/13   Historical Provider, MD  aspirin 81 MG tablet Take 81 mg by mouth daily.    Historical Provider, MD  Calcium Carbonate Antacid (TUMS PO) Take by mouth daily.    Historical Provider, MD  IBUPROFEN PO Take by mouth as needed.    Historical Provider, MD  Multiple Vitamin (MULTIVITAMIN) capsule Take 1 capsule by mouth daily.    Historical Provider, MD  potassium chloride (K-DUR) 10 MEQ tablet  07/15/13   Historical Provider, MD  potassium chloride (K-DUR,KLOR-CON) 10 MEQ tablet Take 1 tablet (10 mEq total) by mouth 2 (two) times daily. 07/11/12 07/11/13  Thayer Headings, MD  triamterene-hydrochlorothiazide (MAXZIDE-25) 37.5-25 MG per tablet Take 0.5 each (0.5 tablets total) by mouth daily. 05/22/12 05/22/13  Thayer Headings, MD   BP 176/69 mmHg  Pulse 55  Temp(Src) 97.7 F (36.5 C) (Oral)  Resp 16  Ht 5\' 7"  (1.702 m)  Wt 64.411 kg  BMI 22.24 kg/m2  SpO2 99% Physical Exam  Constitutional: She is oriented to person, place, and time. She appears well-developed and well-nourished. No distress.  HENT:  Mouth/Throat: Oropharynx is clear and moist.  Eyes: Conjunctivae are normal. No scleral icterus.  Neck: Neck supple. No tracheal deviation present.  No bruit  Cardiovascular: Normal rate, regular rhythm, normal heart sounds and intact distal pulses.   No murmur  heard. Pulmonary/Chest: Effort normal and breath sounds normal. No respiratory distress.  Abdominal: Soft. Normal appearance and bowel sounds are normal. She exhibits no distension. There is no tenderness.  Genitourinary:  No cva tenderness  Musculoskeletal: She exhibits no edema or tenderness.  Neurological: She is alert and oriented to person, place, and time.  Motor intact bil. Ambulates w steady gait.   Skin: Skin is warm and dry. No rash noted. She is not diaphoretic.  Psychiatric: She has a normal mood and affect.  Nursing note and vitals reviewed.   ED Course  Procedures (including critical care time) Labs Review  Results for orders placed or performed during the hospital encounter of [id number]  Basic metabolic panel  Result Value Ref Range   Sodium 137 135 - 145 mmol/L   Potassium 4.0 3.5 - 5.1 mmol/L   Chloride 99 (L) 101 - 111 mmol/L   CO2 27 22 - 32 mmol/L   Glucose, Bld 125 (H) 65 - 99 mg/dL   BUN 13 6 - 20 mg/dL   Creatinine, Ser 0.92 0.44 - 1.00 mg/dL   Calcium 10.3 8.9 - 10.3 mg/dL   GFR calc non Af Amer 54 (L) >60 mL/min   GFR calc Af Amer >60 >60 mL/min   Anion gap 11 5 - 15  CBC  Result Value Ref Range   WBC 6.8 4.0 - 10.5 K/uL   RBC 4.39 3.87 - 5.11 MIL/uL   Hemoglobin 13.8 12.0 - 15.0 g/dL   HCT 41.2 36.0 - 46.0 %   MCV 93.8 78.0 - 100.0 fL   MCH 31.4 26.0 - 34.0 pg   MCHC 33.5 30.0 - 36.0 g/dL   RDW 13.0 11.5 - 15.5 %   Platelets 282 150 - 400 K/uL      I have personally reviewed and evaluated these images and lab results as part of my medical decision-making.   EKG Interpretation   Date/Time:  Sunday July 12 2015 13:56:10 EDT Ventricular Rate:  54 PR Interval:  252 QRS Duration: 90 QT Interval:  446 QTC Calculation: 422 R Axis:   63 Text Interpretation:  Sinus bradycardia with 1st degree A-V block No  previous tracing Confirmed by Ashok Cordia  MD, Lennette Bihari ([id number]) on 07/12/2015  5:17:30 PM      MDM   Ecg. Labs.  Po fluids.  Pt  ambulatory in ED, no faintness or dizziness.  Pt has not yet taken her amlodipine today, will give her normal dose of her med.  No current symptoms, feels at baseline, and appears stable for d/c.      Lajean Saver, MD 07/12/15 [phone]

## 2015-07-12 NOTE — Discharge Instructions (Signed)
It was our pleasure to provide your ER care today - we hope that you feel better.  Rest. Drink adequate fluids.  Follow up with your doctor this week for recheck - have blood pressure and heart rate rechecked then.   Return to ER if worse, new symptoms, fever, trouble breathing, fainting, chest pain, other concern.    Near-Syncope Near-syncope (commonly known as near fainting) is sudden weakness, dizziness, or feeling like you might pass out. During an episode of near-syncope, you may also develop pale skin, have tunnel vision, or feel sick to your stomach (nauseous). Near-syncope may occur when getting up after sitting or while standing for a long time. It is caused by a sudden decrease in blood flow to the brain. This decrease can result from various causes or triggers, most of which are not serious. However, because near-syncope can sometimes be a sign of something serious, a medical evaluation is required. The specific cause is often not determined. HOME CARE INSTRUCTIONS  Monitor your condition for any changes. The following actions may help to alleviate any discomfort you are experiencing:  Have someone stay with you until you feel stable.  Lie down right away and prop your feet up if you start feeling like you might faint. Breathe deeply and steadily. Wait until all the symptoms have passed. Most of these episodes last only a few minutes. You may feel tired for several hours.   Drink enough fluids to keep your urine clear or pale yellow.   If you are taking blood pressure or heart medicine, get up slowly when seated or lying down. Take several minutes to sit and then stand. This can reduce dizziness.  Follow up with your health care provider as directed. SEEK IMMEDIATE MEDICAL CARE IF:   You have a severe headache.   You have unusual pain in the chest, abdomen, or back.   You are bleeding from the mouth or rectum, or you have black or tarry stool.   You have an irregular  or very fast heartbeat.   You have repeated fainting or have seizure-like jerking during an episode.   You faint when sitting or lying down.   You have confusion.   You have difficulty walking.   You have severe weakness.   You have vision problems.  MAKE SURE YOU:   Understand these instructions.  Will watch your condition.  Will get help right away if you are not doing well or get worse.   This information is not intended to replace advice given to you by your health care provider. Make sure you discuss any questions you have with your health care provider.   Document Released: 03/28/2005 Document Revised: 04/02/2013 Document Reviewed: 08/31/2012 Elsevier Interactive Patient Education 2016 [employer].    Hypertension Hypertension, commonly called high blood pressure, is when the force of blood pumping through your arteries is too strong. Your arteries are the blood vessels that carry blood from your heart throughout your body. A blood pressure reading consists of a higher number over a lower number, such as 110/72. The higher number (systolic) is the pressure inside your arteries when your heart pumps. The lower number (diastolic) is the pressure inside your arteries when your heart relaxes. Ideally you want your blood pressure below 120/80. Hypertension forces your heart to work harder to pump blood. Your arteries may become narrow or stiff. Having untreated or uncontrolled hypertension can cause heart attack, stroke, kidney disease, and other problems. RISK FACTORS Some risk factors for  high blood pressure are controllable. Others are not.  Risk factors you cannot control include:   Race. You may be at higher risk if you are African American.  Age. Risk increases with age.  Gender. Men are at higher risk than women before age 77 years. After age 4, women are at higher risk than men. Risk factors you can control include:  Not getting enough exercise or physical  activity.  Being overweight.  Getting too much fat, sugar, calories, or salt in your diet.  Drinking too much alcohol. SIGNS AND SYMPTOMS Hypertension does not usually cause signs or symptoms. Extremely high blood pressure (hypertensive crisis) may cause headache, anxiety, shortness of breath, and nosebleed. DIAGNOSIS To check if you have hypertension, your health care provider will measure your blood pressure while you are seated, with your arm held at the level of your heart. It should be measured at least twice using the same arm. Certain conditions can cause a difference in blood pressure between your right and left arms. A blood pressure reading that is higher than normal on one occasion does not mean that you need treatment. If it is not clear whether you have high blood pressure, you may be asked to return on a different day to have your blood pressure checked again. Or, you may be asked to monitor your blood pressure at home for 1 or more weeks. TREATMENT Treating high blood pressure includes making lifestyle changes and possibly taking medicine. Living a healthy lifestyle can help lower high blood pressure. You may need to change some of your habits. Lifestyle changes may include:  Following the DASH diet. This diet is high in fruits, vegetables, and whole grains. It is low in salt, red meat, and added sugars.  Keep your sodium intake below 2,300 mg per day.  Getting at least 30-45 minutes of aerobic exercise at least 4 times per week.  Losing weight if necessary.  Not smoking.  Limiting alcoholic beverages.  Learning ways to reduce stress. Your health care provider may prescribe medicine if lifestyle changes are not enough to get your blood pressure under control, and if one of the following is true:  You are 105-75 years of age and your systolic blood pressure is above 140.  You are 24 years of age or older, and your systolic blood pressure is above 150.  Your diastolic  blood pressure is above 90.  You have diabetes, and your systolic blood pressure is over [id number] or your diastolic blood pressure is over 90.  You have kidney disease and your blood pressure is above 140/90.  You have heart disease and your blood pressure is above 140/90. Your personal target blood pressure may vary depending on your medical conditions, your age, and other factors. HOME CARE INSTRUCTIONS  Have your blood pressure rechecked as directed by your health care provider.   Take medicines only as directed by your health care provider. Follow the directions carefully. Blood pressure medicines must be taken as prescribed. The medicine does not work as well when you skip doses. Skipping doses also puts you at risk for problems.  Do not smoke.   Monitor your blood pressure at home as directed by your health care provider. SEEK MEDICAL CARE IF:   You think you are having a reaction to medicines taken.  You have recurrent headaches or feel dizzy.  You have swelling in your ankles.  You have trouble with your vision. SEEK IMMEDIATE MEDICAL CARE IF:  You develop a severe  headache or confusion.  You have unusual weakness, numbness, or feel faint.  You have severe chest or abdominal pain.  You vomit repeatedly.  You have trouble breathing. MAKE SURE YOU:   Understand these instructions.  Will watch your condition.  Will get help right away if you are not doing well or get worse.   This information is not intended to replace advice given to you by your health care provider. Make sure you discuss any questions you have with your health care provider.   Document Released: 03/28/2005 Document Revised: 08/12/2014 Document Reviewed: 01/18/2013 Elsevier Interactive Patient Education [employer].

## 2015-07-16 DIAGNOSIS — Z6822 Body mass index (BMI) 22.0-22.9, adult: Secondary | ICD-10-CM | POA: Diagnosis not present

## 2015-07-16 DIAGNOSIS — I10 Essential (primary) hypertension: Secondary | ICD-10-CM | POA: Diagnosis not present

## 2015-07-16 DIAGNOSIS — R55 Syncope and collapse: Secondary | ICD-10-CM | POA: Diagnosis not present

## 2015-07-30 ENCOUNTER — Other Ambulatory Visit (INDEPENDENT_AMBULATORY_CARE_PROVIDER_SITE_OTHER): Payer: Medicare HMO

## 2015-07-30 DIAGNOSIS — E042 Nontoxic multinodular goiter: Secondary | ICD-10-CM

## 2015-07-30 LAB — BASIC METABOLIC PANEL
BUN: 13 mg/dL (ref 6–23)
CALCIUM: 10 mg/dL (ref 8.4–10.5)
CO2: 31 mEq/L (ref 19–32)
CREATININE: 1.01 mg/dL (ref 0.40–1.20)
Chloride: 99 mEq/L (ref 96–112)
GFR: 54.83 mL/min — AB (ref >60.00)
GLUCOSE: 187 mg/dL — AB (ref 70–99)
Potassium: 4 mEq/L (ref 3.5–5.1)
Sodium: 137 mEq/L (ref 135–145)

## 2015-07-30 LAB — T4, FREE: Free T4: 1 ng/dL (ref 0.60–1.60)

## 2015-07-30 LAB — TSH: TSH: 1.53 u[IU]/mL (ref 0.35–4.50)

## 2015-08-05 ENCOUNTER — Encounter: Payer: Self-pay | Admitting: Endocrinology

## 2015-08-05 ENCOUNTER — Ambulatory Visit (INDEPENDENT_AMBULATORY_CARE_PROVIDER_SITE_OTHER): Payer: Medicare HMO | Admitting: Endocrinology

## 2015-08-05 VITALS — BP 124/74 | HR 60 | Temp 98.0°F | Resp 14 | Ht 68.0 in | Wt 142.0 lb

## 2015-08-05 DIAGNOSIS — E042 Nontoxic multinodular goiter: Secondary | ICD-10-CM

## 2015-08-05 NOTE — Progress Notes (Signed)
Patient ID: Michelle Gardner, female   DOB: November 29, 1924, 82 y.o.   MRN: [id number]    Reason for Appointment: Goiter, followup    History of Present Illness:   The patient's thyroid enlargement was first discovered in 2005 She had subclinical hyperthyroidism associated with this and was treated with 29 mCi of I-131 Subsequently her goiter has been smaller in size and she has had consistently normal thyroid functions  She has had no new difficulty with swallowing or local pressure  Thyroid levels as follows:  Lab Results  Component Value Date   TSH 1.53 07/30/2015   TSH 3.38 07/30/2014   TSH 1.96 07/30/2013   FREET4 1.00 07/30/2015   FREET4 0.99 07/30/2014   FREET4 0.77 07/30/2013     HYPERCALCEMIA: Her highest calcium has been 10.7 associated with a PTH level of 31, normal bone density and no history of kidney stones She is taking vitamin D3 1000 units, has baseline vitamin D level of 29. Also taking calcium plus vitamin D supplement Calcium level is Again normal at 10.0  HYPERTENSION: Mild and well controlled with Maxzide half tablet and Norvasc followed by PCP  LABS:  Lab on 07/30/2015  Component Date Value Ref Range Status  . TSH 07/30/2015 1.53  0.35 - 4.50 uIU/mL Final  . Free T4 07/30/2015 1.00  0.60 - 1.60 ng/dL Final  . Sodium 07/30/2015 137  135 - 145 mEq/L Final  . Potassium 07/30/2015 4.0  3.5 - 5.1 mEq/L Final  . Chloride 07/30/2015 99  96 - 112 mEq/L Final  . CO2 07/30/2015 31  19 - 32 mEq/L Final  . Glucose, Bld 07/30/2015 187* 70 - 99 mg/dL Final  . BUN 07/30/2015 13  6 - 23 mg/dL Final  . Creatinine, Ser 07/30/2015 1.01  0.40 - 1.20 mg/dL Final  . Calcium 07/30/2015 10.0  8.4 - 10.5 mg/dL Final  . GFR 07/30/2015 54.83* >60.00 mL/min Final        Medication List       This list is accurate as of: 08/05/15 11:59 PM.  Always use your most recent med list.               amLODipine 5 MG tablet  Commonly known as:  NORVASC     aspirin 81 MG tablet   Take 81 mg by mouth daily.     multivitamin capsule  Take 1 capsule by mouth daily.     potassium chloride 10 MEQ tablet  Commonly known as:  K-DUR,KLOR-CON  Take 1 tablet (10 mEq total) by mouth 2 (two) times daily.     potassium chloride 10 MEQ tablet  Commonly known as:  K-DUR     triamterene-hydrochlorothiazide 37.5-25 MG tablet  Commonly known as:  MAXZIDE-25  Take 0.5 each (0.5 tablets total) by mouth daily.     TUMS PO  Take by mouth daily.        Allergies:  Allergies  Allergen Reactions  . Codeine     Past Medical History  Diagnosis Date  . HTN (hypertension)   . MVP (mitral valve prolapse)   . Mitral regurgitation   . Peptic ulcer   . Near syncope   . Hyperlipidemia     Past Surgical History  Procedure Laterality Date  . Neck surgery    . Elbow surgery      Wired  . Foot surgery      Cyst  . US echocardiography  01-02-2002    EF 65-70%  . Cataract  extraction Bilateral     Family History  Problem Relation Age of Onset  . Heart failure Father   . Other Brother   . Heart attack Brother     Social History:  reports that she has never smoked. She does not have any smokeless tobacco history on file. She reports that she does not drink alcohol or use illicit drugs.   Review of Systems:   Weight history:       Wt Readings from Last 3 Encounters:  08/05/15 142 lb (64.411 kg)  07/12/15 142 lb (64.411 kg)  08/04/14 138 lb 9.6 oz (62.869 kg)          Examination:   BP 124/74 mmHg  Pulse 60  Temp(Src) 98 F (36.7 C)  Resp 14  Ht 5\' 8"  (1.727 m)  Wt 142 lb (64.411 kg)  BMI 21.60 kg/m2  SpO2 96%   General Appearance:  looks well   Neck: The thyroid is not palpable on either side on swallowing:  REFLEXES: at biceps are normal.        Assessment/Plan:  Multinodular goiter, this is again not palpable and has receded with her I-131 treatment Thyroid functions are normal Since she does not have any active thyroid disease she can  follow-up with her PCP now   HYPERCALCEMIA and probable mild hyperparathyroidism:  Since her calcium level is consistently normal now will not need any further evaluation.  She can continue follow-up with her gynecologist for bone density every other year    [facility] 08/06/2015 9:14 PM

## 2015-08-11 ENCOUNTER — Encounter: Payer: Self-pay | Admitting: Cardiovascular Disease

## 2015-08-11 ENCOUNTER — Ambulatory Visit (INDEPENDENT_AMBULATORY_CARE_PROVIDER_SITE_OTHER): Payer: Medicare HMO | Admitting: Cardiovascular Disease

## 2015-08-11 VITALS — BP 168/70 | HR 61 | Ht 68.0 in | Wt 142.8 lb

## 2015-08-11 DIAGNOSIS — R55 Syncope and collapse: Secondary | ICD-10-CM

## 2015-08-11 DIAGNOSIS — I10 Essential (primary) hypertension: Secondary | ICD-10-CM | POA: Diagnosis not present

## 2015-08-11 NOTE — Progress Notes (Signed)
Michelle Gardner Date of Birth  07/12/1924        Problem LIst: 1. Hypertension 2. Mitral valve prolapse 3. S/p radioactive iodine  4. Sinus Bradycardia - chronic 5. Dizziness   History of Present Illness:  Michelle Gardner is doing well. She has a history of hypertension and mild mitral prolapse. She's been able to do all of her normal activities without any significant problems.  Aug 11, 2015:   Michelle Gardner had an episode of lightheadedness. Happened in church,  Got very hot,  Was up in the choir, was wearing a choir robe Had eaten breakfast  Her pastor's wife Michelle Gardner's sister ) took her home  BP is still a bit a elevated. Its' time for her meds  Was seen by Dr. Sharlett Iles - he increased the amlodipine to 5 mg a day .  BP readings at home are usually pretty well controlled.      Current Outpatient Prescriptions  Medication Sig Dispense Refill  . amLODipine (NORVASC) 5 MG tablet Take 5 mg by mouth daily.     [name] aspirin 81 MG tablet Take 81 mg by mouth daily.    . Calcium Carbonate Antacid (TUMS PO) Take by mouth daily.    [name] CALCIUM PO Take 1 capsule by mouth daily.    . Multiple Vitamin (MULTIVITAMIN) capsule Take 1 capsule by mouth daily.    . potassium chloride (K-DUR,KLOR-CON) 10 MEQ tablet Take 1 tablet (10 mEq total) by mouth 2 (two) times daily. 60 tablet 3  . triamterene-hydrochlorothiazide (MAXZIDE-25) 37.5-25 MG per tablet Take 0.5 each (0.5 tablets total) by mouth daily. 30 tablet 5   No current facility-administered medications for this visit.     Allergies  Allergen Reactions  . Codeine Other (See Comments)    Pt doesn't remember      Past Medical History  Diagnosis Date  . HTN (hypertension)   . MVP (mitral valve prolapse)   . Mitral regurgitation   . Peptic ulcer   . Near syncope   . Hyperlipidemia     Past Surgical History  Procedure Laterality Date  . Neck surgery    . Elbow surgery      Wired  . Foot surgery      Cyst  . US  echocardiography  01-02-2002    EF 65-70%  . Cataract extraction Bilateral     History  Smoking status  . Never Smoker   Smokeless tobacco  . Not on file    History  Alcohol Use No    Family History  Problem Relation Age of Onset  . Heart failure Father   . Other Brother   . Heart attack Brother     Reviw of Systems:  Reviewed in the HPI.  All other systems are negative.  Physical Exam: Blood pressure 168/70, pulse 61, height 5\' 8"  (1.727 m), weight 142 lb 12.8 oz (64.774 kg), SpO2 98 %. General: Well developed, well nourished, in no acute distress.  Head: Normocephalic, atraumatic, sclera non-icteric, mucus membranes are moist,   Neck: Supple. Carotids are 2 + without bruits. No JVD  Lungs: Clear bilaterally to auscultation.  Heart: regular rate.  normal  S1 S2. There is a soft systolic murmur at the LSB.  Abdomen: Soft, non-tender, non-distended with normal bowel sounds. No hepatomegaly. No rebound/guarding. No masses.  Msk:  Strength and tone are normal  Extremities: No clubbing or cyanosis. No edema.  Distal pedal pulses are 2+ and  equal bilaterally.  Neuro: Alert and oriented X 3. Moves all extremities spontaneously.  Psych:  Responds to questions appropriately with a normal affect.  ECG:  Assessment / Plan:   1. Essential HTN: BP is A little bit elevated. Her blood pressure readings at home are typically better. Dr. Sharlett Iles has increased her amlodipine to 5 mg a day.  At this point I do not want to adjust her medications since she had an episode of lightheadedness recently.  2. Near-syncope: Michelle Gardner had an episode of near-syncope while singing in the [employer] risk [employer] at  [employer]. She was wearing a big heavy Choir  robe and became too hot. I think that this is the cause of her near-syncope. As a precaution,  we will get her an echocardiogram and have her wear a 30 day event monitor.  I will see her in 3 months  - sooner if needed

## 2015-08-11 NOTE — Patient Instructions (Signed)
Medication Instructions:  Your physician recommends that you continue on your current medications as directed. Please refer to the Current Medication list given to you today.   Labwork: None Ordered   Testing/Procedures: Your physician has requested that you have an echocardiogram. Echocardiography is a painless test that uses sound waves to create images of your heart. It provides your doctor with information about the size and shape of your heart and how well your heart's chambers and valves are working. This procedure takes approximately one hour. There are no restrictions for this procedure.  Your physician has recommended that you wear an event monitor. Event monitors are medical devices that record the heart's electrical activity. Doctors most often us these monitors to diagnose arrhythmias. Arrhythmias are problems with the speed or rhythm of the heartbeat. The monitor is a small, portable device. You can wear one while you do your normal daily activities. This is usually used to diagnose what is causing palpitations/syncope (passing out).   Follow-Up: Your physician recommends that you schedule a follow-up appointment in: 3 months with Dr. Nahser   If you need a refill on your cardiac medications before your next appointment, please call your pharmacy.   Thank you for choosing [facility]! Hailly Fess, RN [phone]    

## 2015-08-21 DIAGNOSIS — I10 Essential (primary) hypertension: Secondary | ICD-10-CM | POA: Diagnosis not present

## 2015-08-21 DIAGNOSIS — Z6822 Body mass index (BMI) 22.0-22.9, adult: Secondary | ICD-10-CM | POA: Diagnosis not present

## 2015-08-21 DIAGNOSIS — R55 Syncope and collapse: Secondary | ICD-10-CM | POA: Diagnosis not present

## 2015-08-25 ENCOUNTER — Ambulatory Visit (INDEPENDENT_AMBULATORY_CARE_PROVIDER_SITE_OTHER): Payer: Medicare HMO

## 2015-08-25 ENCOUNTER — Ambulatory Visit (HOSPITAL_COMMUNITY): Payer: Medicare HMO | Attending: Cardiovascular Disease

## 2015-08-25 ENCOUNTER — Other Ambulatory Visit: Payer: Self-pay

## 2015-08-25 DIAGNOSIS — I340 Nonrheumatic mitral (valve) insufficiency: Secondary | ICD-10-CM | POA: Insufficient documentation

## 2015-08-25 DIAGNOSIS — I119 Hypertensive heart disease without heart failure: Secondary | ICD-10-CM | POA: Insufficient documentation

## 2015-08-25 DIAGNOSIS — R55 Syncope and collapse: Secondary | ICD-10-CM | POA: Diagnosis not present

## 2015-08-25 DIAGNOSIS — I10 Essential (primary) hypertension: Secondary | ICD-10-CM

## 2015-08-25 DIAGNOSIS — I341 Nonrheumatic mitral (valve) prolapse: Secondary | ICD-10-CM | POA: Diagnosis not present

## 2015-08-29 ENCOUNTER — Telehealth: Payer: Self-pay | Admitting: Physician Assistant

## 2015-08-29 NOTE — Telephone Encounter (Signed)
Called by monitoring company.  Patient with 4.9 second pause. HR 38.  Lasted ~90seconds.  Now 50-60BPM.  She was sleeping at the time.  I spoke to the patient and she has not had any dizziness this morning.  No meds to stop.  Tarri Fuller [facility]

## 2015-08-30 ENCOUNTER — Telehealth: Payer: Self-pay | Admitting: Internal Medicine

## 2015-08-30 NOTE — Telephone Encounter (Signed)
Card[facility]  Received a call from the event monitor company that a 3 second pause was observed on the pt's device. She was sleeping at the time & asymptomatic.  They also mentioned a 4.9 second pause the night before, also asymptomatic while sleeping.  Will route to Dr. Acie Fredrickson in case he would like to expedite follow-up.  Frann Rider, MD

## 2015-08-30 NOTE — Telephone Encounter (Signed)
Pt had an episode of syncope at church several weeks ago. Now is found to have a 4.9 second pause and a 3 second pause. She needs to have a pacer placed ASAP Will route this to EP.   Needs to be worked in Monday or Tuesday

## 2015-08-31 ENCOUNTER — Encounter: Payer: Self-pay | Admitting: Internal Medicine

## 2015-08-31 ENCOUNTER — Ambulatory Visit (INDEPENDENT_AMBULATORY_CARE_PROVIDER_SITE_OTHER): Payer: Medicare HMO | Admitting: Internal Medicine

## 2015-08-31 VITALS — BP 162/74 | HR 59 | Ht 67.0 in | Wt 140.2 lb

## 2015-08-31 DIAGNOSIS — R55 Syncope and collapse: Secondary | ICD-10-CM | POA: Diagnosis not present

## 2015-08-31 DIAGNOSIS — I441 Atrioventricular block, second degree: Secondary | ICD-10-CM

## 2015-08-31 NOTE — Telephone Encounter (Signed)
Melissa is calling patient to have her seen by Dr Rayann Heman today at 1:45

## 2015-08-31 NOTE — Patient Instructions (Signed)
Medication Instructions:  Your physician recommends that you continue on your current medications as directed. Please refer to the Current Medication list given to you today.   Labwork: None ordered   Testing/Procedures: None ordered   Follow-Up: Your physician recommends that you schedule a follow-up appointment in: 6 weeks with Dr Allred   Any Other Special Instructions Will Be Listed Below (If Applicable).     If you need a refill on your cardiac medications before your next appointment, please call your pharmacy.   

## 2015-08-31 NOTE — Progress Notes (Signed)
Electrophysiology Office Note Date: 08/31/2015  ID:  Michelle Gardner, DOB 1924-08-15, MRN [id number]  PCP: Donnajean Lopes, MD Primary Cardiologist: Nahser Referring Physician: Nahser  CC:  Pre-syncope and pauses on event monitor  Michelle Gardner is a 82 y.o. female seen today for Dr Acie Fredrickson.  She recently had a pre-syncopal episode at church while sitting. She had just put on her choir robe and sat down.  She then developed dizziness and a general feeling of unwell.   Her friend behind her said that she slumped over but she denies frank syncope.  She was taken home and her symptoms persisted for several hours.  She was evaluated at the ER where she had normal electrolytes and negative orthostatics.  Echocardiogram was obtained which was normal. She was also placed on 30 day monitor which has demonstrated up to 4 second nocturnal pauses. The patient has not had recurrent syncope or pre-syncope.  She lives at home alone without functional limitations. She denies chest pain, palpitations, dyspnea, PND, orthopnea, nausea, vomiting, dizziness, syncope, edema, weight gain, or early satiety.  Past Medical History  Diagnosis Date  . HTN (hypertension)   . MVP (mitral valve prolapse)   . Mitral regurgitation   . Peptic ulcer   . Near syncope   . Hyperlipidemia    Past Surgical History  Procedure Laterality Date  . Neck surgery    . Elbow surgery      Wired  . Foot surgery      Cyst  . US echocardiography  01-02-2002    EF 65-70%  . Cataract extraction Bilateral     Current Outpatient Prescriptions  Medication Sig Dispense Refill  . amLODipine (NORVASC) 5 MG tablet Take 5 mg by mouth daily.     [name] aspirin 81 MG tablet Take 81 mg by mouth daily.    . Calcium Carbonate Antacid (TUMS PO) Take by mouth daily.    [name] CALCIUM PO Take 1 capsule by mouth daily.    . Multiple Vitamin (MULTIVITAMIN) capsule Take 1 capsule by mouth daily.    . potassium chloride (K-DUR,KLOR-CON) 10 MEQ tablet  Take 1 tablet (10 mEq total) by mouth 2 (two) times daily. 60 tablet 3  . triamterene-hydrochlorothiazide (MAXZIDE-25) 37.5-25 MG per tablet Take 0.5 each (0.5 tablets total) by mouth daily. 30 tablet 5   No current facility-administered medications for this visit.    Allergies:   Codeine   Social History: Social History   Social History  . Marital Status: Widowed    Spouse Name: N/A  . Number of Children: N/A  . Years of Education: N/A   Occupational History  . Not on file.   Social History Main Topics  . Smoking status: Never Smoker   . Smokeless tobacco: Not on file  . Alcohol Use: No  . Drug Use: No  . Sexual Activity: Not on file   Other Topics Concern  . Not on file   Social History Narrative    Family History: Family History  Problem Relation Age of Onset  . Heart failure Father   . Other Brother   . Heart attack Brother     Review of Systems: All other systems reviewed and are otherwise negative except as noted above.   Physical Exam: VS:  There were no vitals taken for this visit. , BMI There is no weight on file to calculate BMI. Wt Readings from Last 3 Encounters:  08/11/15 142 lb 12.8 oz (64.774 kg)  08/05/15 142 lb (64.411 kg)  07/12/15 142 lb (64.411 kg)    GEN- The patient is elderly appearing, alert and oriented x 3 today.   HEENT: normocephalic, atraumatic; sclera clear, conjunctiva pink; hearing intact; oropharynx clear; neck supple  Lungs- Clear to ausculation bilaterally, normal work of breathing.  No wheezes, rales, rhonchi Heart- Regular rate and rhythm, no murmurs, rubs or gallops GI- soft, non-tender, non-distended, bowel sounds present  Extremities- no clubbing, cyanosis, or edema; DP/PT/radial pulses 2+ bilaterally MS- no significant deformity or atrophy Skin- warm and dry, no rash or lesion  Psych- euthymic mood, full affect Neuro- strength and sensation are intact   EKG:  EKG is ordered today. The ekg ordered today shows  sinus brady, rate 59, 1st degree AV block   Recent Labs: 07/12/2015: Hemoglobin 13.8; Platelets 282 07/30/2015: BUN 13; Creatinine, Ser 1.01; Potassium 4.0; Sodium 137; TSH 1.53    Other studies Reviewed: Additional studies/ records that were reviewed today include: Dr Elmarie Shiley note, echo, event monitor strips  Assessment and Plan: 1.  Pre-syncope The patient had an episode of pre-syncope at church where she was dizzy and had a general feeling of unwell that lasted for hours.  Her story is not consistent with an arrhythmic episode Continue event monitor  2.  Nocturnal pauses (2:1 heart block) of up to 4 seconds Asymptomatic and all have occurred while sleeping Will follow for now If pauses occur during the day and are symptomatic, will need to consider pacemaker implantation - discussed with patient and niece today.  She does not snore  3.  HTN Stable No change required today   Current medicines are reviewed at length with the patient today.   The patient does not have concerns regarding her medicines.  The following changes were made today:  none  Labs/ tests ordered today include: none   Disposition:   Follow up with Dr Rayann Heman in 6 weeks   Signed, Thompson Grayer, MD   08/31/2015 1:52 PM   [address] [place] [place] [address] [phone] (office) [phone] (fax)

## 2015-09-01 ENCOUNTER — Telehealth: Payer: Self-pay | Admitting: Internal Medicine

## 2015-09-01 NOTE — Telephone Encounter (Signed)
[facility] paged about abnormal EKG.  SB in 19s with ventricular standstill for 4.1 seconds in setting of Mobitz type II per report.  Pt called and she was sleeping, asymptomatic at time of call with SB and [facility].

## 2015-09-07 ENCOUNTER — Telehealth: Payer: Self-pay | Admitting: Internal Medicine

## 2015-09-07 NOTE — Telephone Encounter (Signed)
Had an episode of 3 s pause at night with underlying rhythm as second degree AV bock type II with hR 40s. She then returned to HR in 50-60s. She was contacted but she did not answer the phone. [facility] member was told that she should call the office if she did not have any symptoms and she had these episode while sleeping, otherwise she should come to the ED; whenever they get in touch with her.

## 2015-09-08 ENCOUNTER — Telehealth: Payer: Self-pay | Admitting: *Deleted

## 2015-09-08 NOTE — Telephone Encounter (Signed)
Received [facility] monitor report that was emailed to monitor tech from 09/07/15 at 3:26 am. Event was second degree AV block Type II, sinus bradycardia, first degree AV block.  An attempt to reach patient was made by MD at 4 am on 5/29 (see phone note)  I attempted to reach patient at this time. Unable to reach at either phone number provided.  Unable to leave a message. Left message on Doreatha Martin phone to call back. (DPR on file)  Pt was seen by Dr. Rayann Heman on 08/31/15.

## 2015-09-08 NOTE — Telephone Encounter (Signed)
No changes in therapy. She has had these documented.

## 2015-10-15 DIAGNOSIS — Z01419 Encounter for gynecological examination (general) (routine) without abnormal findings: Secondary | ICD-10-CM | POA: Diagnosis not present

## 2015-10-15 DIAGNOSIS — M816 Localized osteoporosis [Lequesne]: Secondary | ICD-10-CM | POA: Diagnosis not present

## 2015-10-15 DIAGNOSIS — N958 Other specified menopausal and perimenopausal disorders: Secondary | ICD-10-CM | POA: Diagnosis not present

## 2015-10-15 DIAGNOSIS — Z6822 Body mass index (BMI) 22.0-22.9, adult: Secondary | ICD-10-CM | POA: Diagnosis not present

## 2015-10-15 DIAGNOSIS — Z13820 Encounter for screening for osteoporosis: Secondary | ICD-10-CM | POA: Diagnosis not present

## 2015-10-15 DIAGNOSIS — Z779 Other contact with and (suspected) exposures hazardous to health: Secondary | ICD-10-CM | POA: Diagnosis not present

## 2015-10-21 ENCOUNTER — Encounter: Payer: Self-pay | Admitting: Internal Medicine

## 2015-10-21 ENCOUNTER — Encounter (INDEPENDENT_AMBULATORY_CARE_PROVIDER_SITE_OTHER): Payer: Self-pay

## 2015-10-21 ENCOUNTER — Ambulatory Visit (INDEPENDENT_AMBULATORY_CARE_PROVIDER_SITE_OTHER): Payer: Medicare HMO | Admitting: Internal Medicine

## 2015-10-21 VITALS — BP 150/68 | HR 50 | Ht 67.0 in | Wt 140.4 lb

## 2015-10-21 DIAGNOSIS — R55 Syncope and collapse: Secondary | ICD-10-CM

## 2015-10-21 DIAGNOSIS — I441 Atrioventricular block, second degree: Secondary | ICD-10-CM | POA: Diagnosis not present

## 2015-10-21 DIAGNOSIS — I10 Essential (primary) hypertension: Secondary | ICD-10-CM

## 2015-10-21 NOTE — Patient Instructions (Signed)
Medication Instructions:  Your physician recommends that you continue on your current medications as directed. Please refer to the Current Medication list given to you today.  Labwork: None ordered.  Testing/Procedures: None ordered.  Follow-Up: Your physician recommends that you schedule a follow-up appointment as needed.   Any Other Special Instructions Will Be Listed Below (If Applicable).     If you need a refill on your cardiac medications before your next appointment, please call your pharmacy.   

## 2015-10-21 NOTE — Progress Notes (Signed)
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 female who presents today for routine electrophysiology followup.  Since last being seen in our clinic, the patient reports doing very well.  No further presyncope or syncope.  No concerns of bradycardia. Today, she denies symptoms of palpitations, chest pain, shortness of breath,  lower extremity edema, or dizziness..  The patient is otherwise without complaint today.   Past Medical History  Diagnosis Date  . HTN (hypertension)   . MVP (mitral valve prolapse)   . Mitral regurgitation   . Peptic ulcer   . Near syncope   . Hyperlipidemia    Past Surgical History  Procedure Laterality Date  . Neck surgery    . Elbow surgery      Wired  . Foot surgery      Cyst  . US echocardiography  01-02-2002    EF 65-70%  . Cataract extraction Bilateral     ROS- all systems are reviewed and negatives except as per HPI above  Current Outpatient Prescriptions  Medication Sig Dispense Refill  . acetaminophen (TYLENOL) 500 MG tablet Take 250 mg by mouth at bedtime as needed (pain).    [name] amLODipine (NORVASC) 5 MG tablet Take 5 mg by mouth daily.     [name] aspirin 81 MG tablet Take 81 mg by mouth daily.    . Calcium Carbonate Antacid (TUMS PO) Take 1 tablet by mouth daily.     [name] CALCIUM PO Take 1 capsule by mouth daily.    . Multiple Vitamin (MULTIVITAMIN) capsule Take 1 capsule by mouth daily.    . potassium chloride (K-DUR,KLOR-CON) 10 MEQ tablet Take 10 mEq by mouth daily.    [name] triamterene-hydrochlorothiazide (MAXZIDE-25) 37.5-25 MG tablet Take 0.5 tablets by mouth daily.     No current facility-administered medications for this visit.    Physical Exam: Filed Vitals:   10/21/15 1052  BP: 150/68  Pulse: 50  Height: 5\' 7"  (1.702 m)  Weight: 140 lb 6.4 oz (63.685 kg)    GEN- The patient is well appearing, alert and oriented x 3 today.   Head- normocephalic, atraumatic Eyes-  Sclera clear, conjunctiva  pink Ears- hearing intact Oropharynx- clear Lungs- Clear to ausculation bilaterally, normal work of breathing Heart- Regular rate and rhythm, no murmurs, rubs or gallops, PMI not laterally displaced GI- soft, NT, ND, + BS Extremities- no clubbing, cyanosis, or edema  Assessment and Plan:  1. Pre-syncope The patient had an episode of pre-syncope at church where she was dizzy and had a general feeling of unwell that lasted for hours. Her story is not consistent with an arrhythmic episode (likely a vagal event).  No further workup planned  2. Nocturnal pauses (2:1 heart block) of up to 4 seconds Asymptomatic and all have occurred while sleeping No indication for pacing at this time  3. HTN Elevated today but she reports good BP control at home.   No change required today  Follow-up with Dr Acie Fredrickson as scheduled I will see as needed going forward  Thompson Grayer MD, [facility] 10/21/2015 11:39 AM

## 2015-10-29 DIAGNOSIS — L509 Urticaria, unspecified: Secondary | ICD-10-CM | POA: Diagnosis not present

## 2015-10-29 DIAGNOSIS — Z6822 Body mass index (BMI) 22.0-22.9, adult: Secondary | ICD-10-CM | POA: Diagnosis not present

## 2015-11-27 ENCOUNTER — Encounter: Payer: Self-pay | Admitting: Cardiovascular Disease

## 2015-11-27 ENCOUNTER — Ambulatory Visit (INDEPENDENT_AMBULATORY_CARE_PROVIDER_SITE_OTHER): Payer: Medicare HMO | Admitting: Cardiovascular Disease

## 2015-11-27 VITALS — BP 144/70 | HR 48 | Ht 67.0 in | Wt 142.8 lb

## 2015-11-27 DIAGNOSIS — R55 Syncope and collapse: Secondary | ICD-10-CM | POA: Diagnosis not present

## 2015-11-27 DIAGNOSIS — I10 Essential (primary) hypertension: Secondary | ICD-10-CM

## 2015-11-27 NOTE — Patient Instructions (Signed)
Medication Instructions:  Your physician recommends that you continue on your current medications as directed. Please refer to the Current Medication list given to you today.   Labwork: None Ordered   Testing/Procedures: None Ordered   Follow-Up: Your physician wants you to follow-up in: 6 months with Dr. Nahser.  You will receive a reminder letter in the mail two months in advance. If you don't receive a letter, please call our office to schedule the follow-up appointment.   If you need a refill on your cardiac medications before your next appointment, please call your pharmacy.   Thank you for choosing [facility]! Sumiye Hirth, RN [phone]    

## 2015-11-27 NOTE — Progress Notes (Signed)
[name] Date of Birth  [birth year]        Problem LIst: 1. Hypertension 2. Mitral valve prolapse 3. S/p radioactive iodine  4. Sinus Bradycardia - chronic 5. Dizziness   History of Present Illness:  Michelle Gardner is doing well. She has a history of hypertension and mild mitral prolapse. She's been able to do all of her normal activities without any significant problems.  Aug 11, 2015:   Ms. Spicer had an episode of lightheadedness. Happened in church,  Got very hot,  Was up in the choir, was wearing a choir robe Had eaten breakfast  Her pastor's wife [name]'s sister ) took her home  BP is still a bit a elevated. Its' time for her meds  Was seen by Dr. Sharlett Iles - he increased the amlodipine to 5 mg a day .  BP readings at home are usually pretty well controlled.   Aug. 18, 2017  [name] is seen today  No CP , no dizziness. Feeling well now  She had an episode of dizziness while at church.    Has had bradycardia and has pauses on the monitor during sleep .  Has never had syncope   Saw Dr. Rayann Heman  - does not think she needs a pacer at this point .     Current Outpatient Prescriptions  Medication Sig Dispense Refill  . acetaminophen (TYLENOL) 500 MG tablet Take 250 mg by mouth at bedtime as needed (pain).    [name] amLODipine (NORVASC) 5 MG tablet Take 5 mg by mouth daily.     [name] aspirin 81 MG tablet Take 81 mg by mouth daily.    . Calcium Carbonate Antacid (TUMS PO) Take 1 tablet by mouth daily.     [name] CALCIUM PO Take 1 capsule by mouth daily.    . Multiple Vitamin (MULTIVITAMIN) capsule Take 1 capsule by mouth daily.    . potassium chloride (K-DUR,KLOR-CON) 10 MEQ tablet Take 10 mEq by mouth daily.    [name] triamterene-hydrochlorothiazide (MAXZIDE-25) 37.5-25 MG tablet Take 0.5 tablets by mouth daily.     No current facility-administered medications for this visit.      Allergies  Allergen Reactions  . Codeine Other (See Comments)    Pt doesn't  remember      Past Medical History:  Diagnosis Date  . HTN (hypertension)   . Hyperlipidemia   . Mitral regurgitation   . MVP (mitral valve prolapse)   . Near syncope   . Peptic ulcer     Past Surgical History:  Procedure Laterality Date  . CATARACT EXTRACTION Bilateral   . ELBOW SURGERY     Wired  . FOOT SURGERY     Cyst  . NECK SURGERY    . US ECHOCARDIOGRAPHY  01-02-2002   EF 65-70%    History  Smoking Status  . Never Smoker  Smokeless Tobacco  . Not on file    History  Alcohol Use No    Family History  Problem Relation Age of Onset  . Heart failure Father   . Other Brother   . Heart attack Brother     Reviw of Systems:  Reviewed in the HPI.  All other systems are negative.  Physical Exam: Blood pressure (!) 144/70, pulse (!) 48, height 5\' 7"  (1.702 m), weight 142 lb 12.8 oz (64.8 kg). General: Well developed, well nourished, in no acute distress.  Head: Normocephalic, atraumatic, sclera non-icteric, mucus membranes are moist,  Neck: Supple. Carotids are 2 + without bruits. No JVD  Lungs: Clear bilaterally to auscultation.  Heart: regular rate.  normal  S1 S2. There is a soft systolic murmur at the LSB.  Abdomen: Soft, non-tender, non-distended with normal bowel sounds. No hepatomegaly. No rebound/guarding. No masses.  Msk:  Strength and tone are normal  Extremities: No clubbing or cyanosis. No edema.  Distal pedal pulses are 2+ and equal bilaterally.  Neuro: Alert and oriented X 3. Moves all extremities spontaneously.  Psych:  Responds to questions appropriately with a normal affect.  ECG:  Assessment / Plan:   1. Essential HTN: BP is A little bit elevated. Her blood pressure readings at home are typically better. Dr. Sharlett Iles has increased her amlodipine to 5 mg a day.  At this point I do not want to adjust her medications since she had an episode of lightheadedness recently.  2. Near-syncope: Mrs. Breitzman had an episode of  near-syncope while singing in the choir at  [employer]. She was wearing a big heavy Choir  robe and became too hot. I think that this is the cause of her near-syncope. She has persistent bradycardia. She's not had any episodes of syncope or presyncope. She does have sinus pauses when she is sleeping. These episodes have only occurred during sleep.  I've advised her to call us if she has any episodes of syncope or presyncope. I'll see her in 6 months-sooner if needed.     [name], MD  11/27/2015 4:28 PM    [address],  [place], [place]  [address] Pager [phone] Phone: [phone]; Fax: [phone]

## 2015-12-01 DIAGNOSIS — R69 Illness, unspecified: Secondary | ICD-10-CM | POA: Diagnosis not present

## 2015-12-07 ENCOUNTER — Other Ambulatory Visit: Payer: Self-pay | Admitting: Internal Medicine

## 2015-12-07 DIAGNOSIS — Z1231 Encounter for screening mammogram for malignant neoplasm of breast: Secondary | ICD-10-CM

## 2015-12-16 DIAGNOSIS — E784 Other hyperlipidemia: Secondary | ICD-10-CM | POA: Diagnosis not present

## 2015-12-16 DIAGNOSIS — I10 Essential (primary) hypertension: Secondary | ICD-10-CM | POA: Diagnosis not present

## 2015-12-16 DIAGNOSIS — M810 Age-related osteoporosis without current pathological fracture: Secondary | ICD-10-CM | POA: Diagnosis not present

## 2015-12-16 DIAGNOSIS — E041 Nontoxic single thyroid nodule: Secondary | ICD-10-CM | POA: Diagnosis not present

## 2015-12-17 DIAGNOSIS — H40013 Open angle with borderline findings, low risk, bilateral: Secondary | ICD-10-CM | POA: Diagnosis not present

## 2015-12-17 DIAGNOSIS — Z961 Presence of intraocular lens: Secondary | ICD-10-CM | POA: Diagnosis not present

## 2015-12-17 DIAGNOSIS — H26492 Other secondary cataract, left eye: Secondary | ICD-10-CM | POA: Diagnosis not present

## 2015-12-22 DIAGNOSIS — I10 Essential (primary) hypertension: Secondary | ICD-10-CM | POA: Diagnosis not present

## 2015-12-22 DIAGNOSIS — Z6822 Body mass index (BMI) 22.0-22.9, adult: Secondary | ICD-10-CM | POA: Diagnosis not present

## 2015-12-22 DIAGNOSIS — E041 Nontoxic single thyroid nodule: Secondary | ICD-10-CM | POA: Diagnosis not present

## 2015-12-22 DIAGNOSIS — E784 Other hyperlipidemia: Secondary | ICD-10-CM | POA: Diagnosis not present

## 2015-12-22 DIAGNOSIS — Z0000 Encounter for general adult medical examination without abnormal findings: Secondary | ICD-10-CM | POA: Diagnosis not present

## 2015-12-22 DIAGNOSIS — M12832 Other specific arthropathies, not elsewhere classified, left wrist: Secondary | ICD-10-CM | POA: Diagnosis not present

## 2015-12-22 DIAGNOSIS — N183 Chronic kidney disease, stage 3 (moderate): Secondary | ICD-10-CM | POA: Diagnosis not present

## 2015-12-22 DIAGNOSIS — M810 Age-related osteoporosis without current pathological fracture: Secondary | ICD-10-CM | POA: Diagnosis not present

## 2015-12-22 DIAGNOSIS — Z1389 Encounter for screening for other disorder: Secondary | ICD-10-CM | POA: Diagnosis not present

## 2015-12-22 DIAGNOSIS — Z23 Encounter for immunization: Secondary | ICD-10-CM | POA: Diagnosis not present

## 2016-01-13 ENCOUNTER — Ambulatory Visit
Admission: RE | Admit: 2016-01-13 | Discharge: 2016-01-13 | Disposition: A | Discharge status: Home / Self Care | Payer: Medicare HMO | Source: Ambulatory Visit | Attending: Internal Medicine | Admitting: Internal Medicine

## 2016-01-13 DIAGNOSIS — Z1231 Encounter for screening mammogram for malignant neoplasm of breast: Secondary | ICD-10-CM

## 2016-04-01 DIAGNOSIS — H1131 Conjunctival hemorrhage, right eye: Secondary | ICD-10-CM | POA: Diagnosis not present

## 2016-05-19 ENCOUNTER — Encounter: Payer: Self-pay | Admitting: Cardiovascular Disease

## 2016-05-25 ENCOUNTER — Ambulatory Visit (INDEPENDENT_AMBULATORY_CARE_PROVIDER_SITE_OTHER): Payer: Medicare HMO | Admitting: Cardiovascular Disease

## 2016-05-25 ENCOUNTER — Encounter: Payer: Self-pay | Admitting: Cardiovascular Disease

## 2016-05-25 VITALS — BP 154/78 | HR 57 | Ht 67.0 in | Wt 148.1 lb

## 2016-05-25 DIAGNOSIS — I10 Essential (primary) hypertension: Secondary | ICD-10-CM | POA: Diagnosis not present

## 2016-05-25 NOTE — Patient Instructions (Signed)
Medication Instructions:  Your physician recommends that you continue on your current medications as directed. Please refer to the Current Medication list given to you today.   Labwork: None Ordered   Testing/Procedures: None Ordered   Follow-Up: Your physician wants you to follow-up in: 6 months with Dr. Nahser.  You will receive a reminder letter in the mail two months in advance. If you don't receive a letter, please call our office to schedule the follow-up appointment.   If you need a refill on your cardiac medications before your next appointment, please call your pharmacy.   Thank you for choosing [facility]! Kellen Hover, RN [phone]    

## 2016-05-25 NOTE — Progress Notes (Signed)
Michelle Gardner Date of Birth  Mar 29, 1925        Problem LIst: 1. Hypertension 2. Mitral valve prolapse 3. S/p radioactive iodine  4. Sinus Bradycardia - chronic 5. Dizziness   History of Present Illness:  Michelle Gardner is doing well. She has a history of hypertension and mild mitral prolapse. She's been able to do all of her normal activities without any significant problems.  Aug 11, 2015:   Michelle Gardner had an episode of lightheadedness. Happened in church,  Got very hot,  Was up in the choir, was wearing a choir robe Had eaten breakfast  Her pastor's wife [name]'s sister ) took her home  BP is still a bit a elevated. Its' time for her meds  Was seen by Dr. Sharlett Iles - he increased the amlodipine to 5 mg a day .  BP readings at home are usually pretty well controlled.   Aug. 18, 2017  Michelle Gardner is seen today  No CP , no dizziness. Feeling well now  She had an episode of dizziness while at church.    Has had bradycardia and has pauses on the monitor during sleep .  Has never had syncope   Saw Dr. Rayann Heman  - does not think she needs a pacer at this point .   Feb. 14, 2018: Doing well BP at home is normal  Is 83 yo now.   Is doing great for 83 yo     Current Outpatient Prescriptions  Medication Sig Dispense Refill  . acetaminophen (TYLENOL) 500 MG tablet Take 250 mg by mouth at bedtime as needed (pain).    [name] amLODipine (NORVASC) 5 MG tablet Take 5 mg by mouth daily.     [name] aspirin 81 MG tablet Take 81 mg by mouth daily.    . Calcium Carbonate Antacid (TUMS PO) Take 1 tablet by mouth daily.     [name] CALCIUM PO Take 1 capsule by mouth daily.    . Multiple Vitamin (MULTIVITAMIN) capsule Take 1 capsule by mouth daily.    . potassium chloride (K-DUR,KLOR-CON) 10 MEQ tablet Take 10 mEq by mouth daily.    [name] triamterene-hydrochlorothiazide (MAXZIDE-25) 37.5-25 MG tablet Take 0.5 tablets by mouth daily.     No current facility-administered medications for this  visit.      Allergies  Allergen Reactions  . Codeine Other (See Comments)    Pt doesn't remember      Past Medical History:  Diagnosis Date  . HTN (hypertension)   . Hyperlipidemia   . Mitral regurgitation   . MVP (mitral valve prolapse)   . Near syncope   . Peptic ulcer     Past Surgical History:  Procedure Laterality Date  . CATARACT EXTRACTION Bilateral   . ELBOW SURGERY     Wired  . FOOT SURGERY     Cyst  . NECK SURGERY    . US ECHOCARDIOGRAPHY  01-02-2002   EF 65-70%    History  Smoking Status  . Never Smoker  Smokeless Tobacco  . Never Used    History  Alcohol Use No    Family History  Problem Relation Age of Onset  . Heart failure Father   . Other Brother   . Heart attack Brother     Reviw of Systems:  Reviewed in the HPI.  All other systems are negative.  Physical Exam: Blood pressure (!) 154/78, pulse (!) 57, height 5\' 7"  (1.702 m), weight 148 lb  1.9 oz (67.2 kg), SpO2 95 %. General: Well developed, well nourished, in no acute distress.  Head: Normocephalic, atraumatic, sclera non-icteric, mucus membranes are moist,   Neck: Supple. Carotids are 2 + without bruits. No JVD  Lungs: Clear bilaterally to auscultation.  Heart: regular rate.  normal  S1 S2. There is a soft systolic murmur at the LSB.  Abdomen: Soft, non-tender, non-distended with normal bowel sounds. No hepatomegaly. No rebound/guarding. No masses.  Msk:  Strength and tone are normal  Extremities: No clubbing or cyanosis. No edema.  Distal pedal pulses are 2+ and equal bilaterally.  Neuro: Alert and oriented X 3. Moves all extremities spontaneously.  Psych:  Responds to questions appropriately with a normal affect.  ECG:  Assessment / Plan:   1. Essential HTN: BP is A little bit elevated. Her blood pressure readings at home are typically better.    At this point I do not want to adjust her medications since she had an episode of lightheadedness recently.  2.  Near-syncope: Michelle Gardner has not has any further episodes of near syncope  I've advised her to call us if she has any episodes of syncope or presyncope. I'll see her in 6 months-sooner if needed.     [name], MD  05/25/2016 9:16 AM    [address],  [place], [place]  [address] Pager [phone] Phone: [phone]; Fax: [phone]

## 2016-06-13 DIAGNOSIS — R69 Illness, unspecified: Secondary | ICD-10-CM | POA: Diagnosis not present

## 2016-06-27 ENCOUNTER — Telehealth: Payer: Self-pay | Admitting: Cardiovascular Disease

## 2016-06-27 NOTE — Telephone Encounter (Signed)
Left message for patient's daughter to call back. 

## 2016-06-27 NOTE — Telephone Encounter (Signed)
Spoke with Ivin Booty, patient's niece and DPR, who called to report that patient had elevated heart rate (90-100 bpm). She states patient took Tylenol for wrist pain earlier and took her BP and pulse just as because, not due to feeling bad. Ivin Booty states she reassured patient that this was not an alarming number but when she checked patient's pulse is was irregular. She is concerned that it is atrial fib which patient has not had in the past. I advised that patient has hx of a 2nd degree AV block and PACs. She denies patient complaints. I asked about scheduling an appointment and she states they would feel better if she could come in and get checked. I scheduled patient for Wednesday 3/21 with Dr. Acie Fredrickson because she states she has a conflict tomorrow. She thanked me for the call.

## 2016-06-27 NOTE — Telephone Encounter (Signed)
New Message  Pt daughter call requesting to speak with RN about pt heart rate. Please call back to discuss

## 2016-06-29 ENCOUNTER — Ambulatory Visit: Payer: Medicare HMO | Admitting: Cardiovascular Disease

## 2016-09-01 DIAGNOSIS — R69 Illness, unspecified: Secondary | ICD-10-CM | POA: Diagnosis not present

## 2016-10-17 DIAGNOSIS — Z6823 Body mass index (BMI) 23.0-23.9, adult: Secondary | ICD-10-CM | POA: Diagnosis not present

## 2016-10-17 DIAGNOSIS — Z779 Other contact with and (suspected) exposures hazardous to health: Secondary | ICD-10-CM | POA: Diagnosis not present

## 2016-10-17 DIAGNOSIS — Z124 Encounter for screening for malignant neoplasm of cervix: Secondary | ICD-10-CM | POA: Diagnosis not present

## 2016-12-02 ENCOUNTER — Other Ambulatory Visit: Payer: Self-pay | Admitting: Internal Medicine

## 2016-12-02 DIAGNOSIS — Z1231 Encounter for screening mammogram for malignant neoplasm of breast: Secondary | ICD-10-CM

## 2016-12-16 DIAGNOSIS — R8299 Other abnormal findings in urine: Secondary | ICD-10-CM | POA: Diagnosis not present

## 2016-12-16 DIAGNOSIS — N390 Urinary tract infection, site not specified: Secondary | ICD-10-CM | POA: Diagnosis not present

## 2016-12-16 DIAGNOSIS — E784 Other hyperlipidemia: Secondary | ICD-10-CM | POA: Diagnosis not present

## 2016-12-16 DIAGNOSIS — E041 Nontoxic single thyroid nodule: Secondary | ICD-10-CM | POA: Diagnosis not present

## 2016-12-16 DIAGNOSIS — I10 Essential (primary) hypertension: Secondary | ICD-10-CM | POA: Diagnosis not present

## 2016-12-16 DIAGNOSIS — M810 Age-related osteoporosis without current pathological fracture: Secondary | ICD-10-CM | POA: Diagnosis not present

## 2016-12-21 DIAGNOSIS — H43812 Vitreous degeneration, left eye: Secondary | ICD-10-CM | POA: Diagnosis not present

## 2016-12-21 DIAGNOSIS — H40013 Open angle with borderline findings, low risk, bilateral: Secondary | ICD-10-CM | POA: Diagnosis not present

## 2016-12-21 DIAGNOSIS — Z961 Presence of intraocular lens: Secondary | ICD-10-CM | POA: Diagnosis not present

## 2016-12-23 ENCOUNTER — Encounter: Payer: Self-pay | Admitting: Cardiovascular Disease

## 2016-12-23 DIAGNOSIS — Z1389 Encounter for screening for other disorder: Secondary | ICD-10-CM | POA: Diagnosis not present

## 2016-12-23 DIAGNOSIS — I10 Essential (primary) hypertension: Secondary | ICD-10-CM | POA: Diagnosis not present

## 2016-12-23 DIAGNOSIS — Z0000 Encounter for general adult medical examination without abnormal findings: Secondary | ICD-10-CM | POA: Diagnosis not present

## 2016-12-23 DIAGNOSIS — Z6823 Body mass index (BMI) 23.0-23.9, adult: Secondary | ICD-10-CM | POA: Diagnosis not present

## 2016-12-23 DIAGNOSIS — Z1212 Encounter for screening for malignant neoplasm of rectum: Secondary | ICD-10-CM | POA: Diagnosis not present

## 2016-12-23 DIAGNOSIS — M810 Age-related osteoporosis without current pathological fracture: Secondary | ICD-10-CM | POA: Diagnosis not present

## 2016-12-23 DIAGNOSIS — E784 Other hyperlipidemia: Secondary | ICD-10-CM | POA: Diagnosis not present

## 2016-12-23 DIAGNOSIS — E041 Nontoxic single thyroid nodule: Secondary | ICD-10-CM | POA: Diagnosis not present

## 2016-12-23 DIAGNOSIS — N183 Chronic kidney disease, stage 3 (moderate): Secondary | ICD-10-CM | POA: Diagnosis not present

## 2016-12-23 DIAGNOSIS — Z23 Encounter for immunization: Secondary | ICD-10-CM | POA: Diagnosis not present

## 2017-01-02 DIAGNOSIS — R69 Illness, unspecified: Secondary | ICD-10-CM | POA: Diagnosis not present

## 2017-01-04 ENCOUNTER — Encounter: Payer: Self-pay | Admitting: Cardiovascular Disease

## 2017-01-04 ENCOUNTER — Ambulatory Visit (INDEPENDENT_AMBULATORY_CARE_PROVIDER_SITE_OTHER): Payer: Medicare HMO | Admitting: Cardiovascular Disease

## 2017-01-04 VITALS — BP 138/80 | HR 59 | Ht 67.0 in | Wt 148.0 lb

## 2017-01-04 DIAGNOSIS — I10 Essential (primary) hypertension: Secondary | ICD-10-CM

## 2017-01-04 DIAGNOSIS — I341 Nonrheumatic mitral (valve) prolapse: Secondary | ICD-10-CM | POA: Diagnosis not present

## 2017-01-04 NOTE — Patient Instructions (Signed)

## 2017-01-04 NOTE — Progress Notes (Signed)
Michelle Gardner Date of Birth  02/05/1925        Problem LIst: 1. Hypertension 2. Mitral valve prolapse 3. S/p radioactive iodine  4. Sinus Bradycardia - chronic 5. Dizziness   History of Present Illness:  Michelle Gardner is doing well. She has a history of hypertension and mild mitral prolapse. She's been able to do all of her normal activities without any significant problems.  Aug 11, 2015:   [name] had an episode of lightheadedness. Happened in church,  Got very hot,  Was up in the choir, was wearing a choir robe Had eaten breakfast  Her pastor's wife Michelle Gardner's sister ) took her home  BP is still a bit a elevated. Its' time for her meds  Was seen by Dr. Sharlett Iles - he increased the amlodipine to 5 mg a day .  BP readings at home are usually pretty well controlled.   Aug. 18, 2017  [name] is seen today  No CP , no dizziness. Feeling well now  She had an episode of dizziness while at church.    Has had bradycardia and has pauses on the monitor during sleep .  Has never had syncope   Saw Dr. Rayann Heman  - does not think she needs a pacer at this point .   Feb. 14, 2018: Doing well BP at home is normal  Is 83 yo now.   Is doing great for 82 yo   Sept. 26, 2018: Is 89  Doing great.   BP and HR look great .  No CP or dyspnea.  (Friend of [name])   Current Outpatient Prescriptions  Medication Sig Dispense Refill  . acetaminophen (TYLENOL) 500 MG tablet Take 250 mg by mouth at bedtime as needed (pain).    [name] amLODipine (NORVASC) 5 MG tablet Take 5 mg by mouth daily.     [name] aspirin 81 MG tablet Take 81 mg by mouth daily.    . Calcium Carbonate Antacid (TUMS PO) Take 1 tablet by mouth daily.     [name] CALCIUM PO Take 1 capsule by mouth daily.    . Multiple Vitamin (MULTIVITAMIN) capsule Take 1 capsule by mouth daily.    . potassium chloride (K-DUR,KLOR-CON) 10 MEQ tablet Take 10 mEq by mouth daily.    [name] triamterene-hydrochlorothiazide (MAXZIDE-25)  37.5-25 MG tablet Take 0.5 tablets by mouth daily.     No current facility-administered medications for this visit.      Allergies  Allergen Reactions  . Codeine Other (See Comments)    Pt doesn't remember      Past Medical History:  Diagnosis Date  . HTN (hypertension)   . Hyperlipidemia   . Mitral regurgitation   . MVP (mitral valve prolapse)   . Near syncope   . Peptic ulcer     Past Surgical History:  Procedure Laterality Date  . CATARACT EXTRACTION Bilateral   . ELBOW SURGERY     Wired  . FOOT SURGERY     Cyst  . NECK SURGERY    . US ECHOCARDIOGRAPHY  01-02-2002   EF 65-70%    History  Smoking Status  . Never Smoker  Smokeless Tobacco  . Never Used    History  Alcohol Use No    Family History  Problem Relation Age of Onset  . Heart failure Father   . Other Brother   . Heart attack Brother     Reviw of Systems:  Reviewed in  the HPI.  All other systems are negative.  Physical Exam: Blood pressure 138/80, pulse (!) 59, height 5\' 7"  (1.702 m), weight 148 lb (67.1 kg), SpO2 98 %.  GEN:  Well nourished, well developed in no acute distress HEENT: Normal NECK: No JVD; No carotid bruits LYMPHATICS: No lymphadenopathy CARDIAC: RR, no murmurs, rubs, gallops RESPIRATORY:  Clear to auscultation without rales, wheezing or rhonchi  ABDOMEN: Soft, non-tender, non-distended MUSCULOSKELETAL:  No edema; No deformity  SKIN: Warm and dry NEUROLOGIC:  Alert and oriented x 3   ECG: Sept. 26, 2018   Sinus brady at 43.  1st degree AV block   Assessment / Plan:   1. Essential HTN: BP is well controlled. Continue same meds     2. Near-syncope: has Had near-syncope in the distant past but has not had any recent episodes. She has sinus bradycardia at baseline. I've advised her let me know if she has any episodes of syncope or Near-syncope.  Will see her in 6 months    [name], MD  01/04/2017 8:47 AM    [address],  [place], [place]  [address] Pager [phone] Phone: [phone]; Fax: [phone]

## 2017-01-13 ENCOUNTER — Ambulatory Visit
Admission: RE | Admit: 2017-01-13 | Discharge: 2017-01-13 | Disposition: A | Discharge status: Home / Self Care | Payer: Medicare HMO | Source: Ambulatory Visit | Attending: Internal Medicine | Admitting: Internal Medicine

## 2017-01-13 DIAGNOSIS — Z1231 Encounter for screening mammogram for malignant neoplasm of breast: Secondary | ICD-10-CM | POA: Diagnosis not present

## 2017-07-10 DIAGNOSIS — R69 Illness, unspecified: Secondary | ICD-10-CM | POA: Diagnosis not present

## 2017-07-25 ENCOUNTER — Encounter (INDEPENDENT_AMBULATORY_CARE_PROVIDER_SITE_OTHER): Payer: Self-pay

## 2017-07-25 ENCOUNTER — Ambulatory Visit: Payer: Medicare HMO | Admitting: Cardiovascular Disease

## 2017-07-25 ENCOUNTER — Encounter: Payer: Self-pay | Admitting: Cardiovascular Disease

## 2017-07-25 VITALS — BP 110/70 | HR 52 | Ht 67.0 in | Wt 147.0 lb

## 2017-07-25 DIAGNOSIS — I10 Essential (primary) hypertension: Secondary | ICD-10-CM

## 2017-07-25 DIAGNOSIS — R55 Syncope and collapse: Secondary | ICD-10-CM | POA: Diagnosis not present

## 2017-07-25 NOTE — Progress Notes (Signed)
Michelle Gardner Date of Birth  02-Oct-1924        Problem LIst: 1. Hypertension 2. Mitral valve prolapse 3. S/p radioactive iodine  4. Sinus Bradycardia - chronic 5. Dizziness    Michelle Gardner is doing well. She has a history of hypertension and mild mitral prolapse. She's been able to do all of her normal activities without any significant problems.  Aug 11, 2015:   Michelle Gardner had an episode of lightheadedness. Happened in church,  Got very hot,  Was up in the choir, was wearing a choir robe Had eaten breakfast  Her pastor's wife [name]'s sister ) took her home  BP is still a bit a elevated. Its' time for her meds  Was seen by Dr. Sharlett Iles - he increased the amlodipine to 5 mg a day .  BP readings at home are usually pretty well controlled.   Aug. 18, 2017  Michelle Gardner is seen today  No CP , no dizziness. Feeling well now  She had an episode of dizziness while at church.    Has had bradycardia and has pauses on the monitor during sleep .  Has never had syncope   Saw Dr. Rayann Heman  - does not think she needs a pacer at this point .   Feb. 14, 2018: Doing well BP at home is normal  Is 84 yo now.   Is doing great for 84 yo   Sept. 26, 2018: Is 89  Doing great.   BP and HR look great .  No CP or dyspnea.  Michelle Gardner of Michelle Gardner)   July 25, 2017:  Is 89 now,  No complaints  BP has been normal at home .   No CP or dyspnea.  No dizziness  Current Outpatient Medications  Medication Sig Dispense Refill  . acetaminophen (TYLENOL) 500 MG tablet Take 250 mg by mouth at bedtime as needed (pain).    [name] amLODipine (NORVASC) 5 MG tablet Take 5 mg by mouth daily.     [name] aspirin 81 MG tablet Take 81 mg by mouth daily.    . Calcium Carbonate Antacid (TUMS PO) Take 1 tablet by mouth daily.     [name] CALCIUM PO Take 1 capsule by mouth daily.    . Multiple Vitamin (MULTIVITAMIN) capsule Take 1 capsule by mouth daily.    . potassium chloride (K-DUR,KLOR-CON) 10 MEQ  tablet Take 10 mEq by mouth daily.    [name] triamterene-hydrochlorothiazide (MAXZIDE-25) 37.5-25 MG tablet Take 0.5 tablets by mouth daily.     No current facility-administered medications for this visit.      Allergies  Allergen Reactions  . Codeine Other (See Comments)    Pt doesn't remember      Past Medical History:  Diagnosis Date  . HTN (hypertension)   . Hyperlipidemia   . Mitral regurgitation   . MVP (mitral valve prolapse)   . Near syncope   . Peptic ulcer     Past Surgical History:  Procedure Laterality Date  . CATARACT EXTRACTION Bilateral   . ELBOW SURGERY     Wired  . FOOT SURGERY     Cyst  . NECK SURGERY    . US ECHOCARDIOGRAPHY  01-02-2002   EF 65-70%    Social History   Tobacco Use  Smoking Status Never Smoker  Smokeless Tobacco Never Used    Social History   Substance and Sexual Activity  Alcohol Use No    Family History  Problem Relation Age of Onset  . Heart failure Father   . Other Brother   . Heart attack Brother     Reviw of Systems:  Reviewed in the HPI.  All other systems are negative.  Physical Exam: Blood pressure 110/70, pulse (!) 52, height 5\' 7"  (1.702 m), weight 147 lb (66.7 kg), SpO2 98 %.  GEN:   Elderly female,  NAD  HEENT: Normal NECK: No JVD; No carotid bruits LYMPHATICS: No lymphadenopathy CARDIAC: R, no murmurs, rubs, gallops RESPIRATORY:  Clear to auscultation without rales, wheezing or rhonchi  ABDOMEN: Soft, non-tender, non-distended MUSCULOSKELETAL:  No edema; No deformity  SKIN: Warm and dry NEUROLOGIC:  Alert and oriented x 3   ECG:  Assessment / Plan:   1. Essential HTN: Blood pressure is well controlled.  Continue current medications.  She will have her labs drawn at her primary medical doctor's office.  2. Near-syncope: has Had near-syncope in the distant past but has not had any recent episodes.   She seems to be doing well and is very stable.   [name], MD  07/25/2017 9:55 AM    [address],  [place], [place]  [address] Pager [phone] Phone: [phone]; Fax: [phone]

## 2017-07-25 NOTE — Patient Instructions (Signed)
Medication Instructions:  Your physician recommends that you continue on your current medications as directed. Please refer to the Current Medication list given to you today.   Labwork: None Ordered   Testing/Procedures: None Ordered   Follow-Up: Your physician wants you to follow-up in: 6 months with Dr. Nahser.  You will receive a reminder letter in the mail two months in advance. If you don't receive a letter, please call our office to schedule the follow-up appointment.   If you need a refill on your cardiac medications before your next appointment, please call your pharmacy.   Thank you for choosing [facility]! Azyah Flett, RN [phone]    

## 2017-10-19 DIAGNOSIS — Z01419 Encounter for gynecological examination (general) (routine) without abnormal findings: Secondary | ICD-10-CM | POA: Diagnosis not present

## 2017-10-19 DIAGNOSIS — Z6823 Body mass index (BMI) 23.0-23.9, adult: Secondary | ICD-10-CM | POA: Diagnosis not present

## 2017-12-06 ENCOUNTER — Other Ambulatory Visit: Payer: Self-pay | Admitting: Internal Medicine

## 2017-12-06 DIAGNOSIS — Z1231 Encounter for screening mammogram for malignant neoplasm of breast: Secondary | ICD-10-CM

## 2017-12-21 DIAGNOSIS — H40013 Open angle with borderline findings, low risk, bilateral: Secondary | ICD-10-CM | POA: Diagnosis not present

## 2017-12-21 DIAGNOSIS — Z961 Presence of intraocular lens: Secondary | ICD-10-CM | POA: Diagnosis not present

## 2018-01-11 DIAGNOSIS — R82998 Other abnormal findings in urine: Secondary | ICD-10-CM | POA: Diagnosis not present

## 2018-01-11 DIAGNOSIS — M810 Age-related osteoporosis without current pathological fracture: Secondary | ICD-10-CM | POA: Diagnosis not present

## 2018-01-11 DIAGNOSIS — I10 Essential (primary) hypertension: Secondary | ICD-10-CM | POA: Diagnosis not present

## 2018-01-11 DIAGNOSIS — E041 Nontoxic single thyroid nodule: Secondary | ICD-10-CM | POA: Diagnosis not present

## 2018-01-11 DIAGNOSIS — E7849 Other hyperlipidemia: Secondary | ICD-10-CM | POA: Diagnosis not present

## 2018-01-16 DIAGNOSIS — R69 Illness, unspecified: Secondary | ICD-10-CM | POA: Diagnosis not present

## 2018-01-17 ENCOUNTER — Ambulatory Visit
Admission: RE | Admit: 2018-01-17 | Discharge: 2018-01-17 | Disposition: A | Discharge status: Home / Self Care | Payer: Medicare HMO | Source: Ambulatory Visit | Attending: Internal Medicine | Admitting: Internal Medicine

## 2018-01-17 DIAGNOSIS — Z1231 Encounter for screening mammogram for malignant neoplasm of breast: Secondary | ICD-10-CM | POA: Diagnosis not present

## 2018-01-18 ENCOUNTER — Encounter: Payer: Self-pay | Admitting: *Deleted

## 2018-01-18 DIAGNOSIS — Z23 Encounter for immunization: Secondary | ICD-10-CM | POA: Diagnosis not present

## 2018-01-18 DIAGNOSIS — R634 Abnormal weight loss: Secondary | ICD-10-CM | POA: Diagnosis not present

## 2018-01-18 DIAGNOSIS — R55 Syncope and collapse: Secondary | ICD-10-CM | POA: Diagnosis not present

## 2018-01-18 DIAGNOSIS — E041 Nontoxic single thyroid nodule: Secondary | ICD-10-CM | POA: Diagnosis not present

## 2018-01-18 DIAGNOSIS — Z6823 Body mass index (BMI) 23.0-23.9, adult: Secondary | ICD-10-CM | POA: Diagnosis not present

## 2018-01-18 DIAGNOSIS — M810 Age-related osteoporosis without current pathological fracture: Secondary | ICD-10-CM | POA: Diagnosis not present

## 2018-01-18 DIAGNOSIS — Z1389 Encounter for screening for other disorder: Secondary | ICD-10-CM | POA: Diagnosis not present

## 2018-01-18 DIAGNOSIS — Z0000 Encounter for general adult medical examination without abnormal findings: Secondary | ICD-10-CM | POA: Diagnosis not present

## 2018-01-18 DIAGNOSIS — I10 Essential (primary) hypertension: Secondary | ICD-10-CM | POA: Diagnosis not present

## 2018-01-23 DIAGNOSIS — Z1212 Encounter for screening for malignant neoplasm of rectum: Secondary | ICD-10-CM | POA: Diagnosis not present

## 2018-02-01 ENCOUNTER — Ambulatory Visit: Payer: Medicare HMO | Admitting: Cardiovascular Disease

## 2018-02-01 ENCOUNTER — Encounter: Payer: Self-pay | Admitting: Cardiovascular Disease

## 2018-02-01 VITALS — BP 130/72 | HR 50 | Ht 67.0 in | Wt 144.6 lb

## 2018-02-01 DIAGNOSIS — I10 Essential (primary) hypertension: Secondary | ICD-10-CM | POA: Diagnosis not present

## 2018-02-01 DIAGNOSIS — R001 Bradycardia, unspecified: Secondary | ICD-10-CM

## 2018-02-01 NOTE — Patient Instructions (Signed)

## 2018-02-01 NOTE — Progress Notes (Signed)
Michelle Gardner Date of Birth  01-31-1925        Problem LIst: 1. Hypertension 2. Mitral valve prolapse 3. S/p radioactive iodine  4. Sinus Bradycardia - chronic 5. Dizziness    Michelle Gardner is doing well. She has a history of hypertension and mild mitral prolapse. She's been able to do all of her normal activities without any significant problems.  Aug 11, 2015:   Michelle Gardner had an episode of lightheadedness. Happened in church,  Got very hot,  Was up in the choir, was wearing a choir robe Had eaten breakfast  Her pastor's wife Michelle Gardner's sister ) took her home  BP is still a bit a elevated. Its' time for her meds  Was seen by Dr. Sharlett Iles - he increased the amlodipine to 5 mg a day .  BP readings at home are usually pretty well controlled.   Aug. 18, 2017  Michelle Gardner is seen today  No CP , no dizziness. Feeling well now  She had an episode of dizziness while at church.    Has had bradycardia and has pauses on the monitor during sleep .  Has never had syncope   Saw Dr. Rayann Heman  - does not think she needs a pacer at this point .   Feb. 14, 2018: Doing well BP at home is normal  Is 84 yo now.   Is doing great for 84 yo   Sept. 26, 2018: Is 84  Doing great.   BP and HR look great .  No CP or dyspnea.  Michelle Gardner of Michelle Gardner)   July 25, 2017:  Is 84 now,  No complaints  BP has been normal at home .   No CP or dyspnea.  No dizziness  Oct. 24, 2019:  Doing well - is 84 yo Has had a slow HR for years No syncope or presyncope  (Sister has a pacer ) , mother lived to 73   Is very healthy,  Does not need a walker or cane to walk  Still lives in her own home. Does her own shopping    Current Outpatient Medications  Medication Sig Dispense Refill  . acetaminophen (TYLENOL) 500 MG tablet Take 250 mg by mouth at bedtime as needed (pain).    [name] amLODipine (NORVASC) 5 MG tablet Take 5 mg by mouth daily.     . Calcium Carbonate Antacid (TUMS PO)  Take 1 tablet by mouth daily.     [name] CALCIUM PO Take 1 capsule by mouth daily.    . Multiple Vitamin (MULTIVITAMIN) capsule Take 1 capsule by mouth daily.    . potassium chloride (K-DUR,KLOR-CON) 10 MEQ tablet Take 10 mEq by mouth daily.    [name] triamterene-hydrochlorothiazide (MAXZIDE-25) 37.5-25 MG tablet Take 0.5 tablets by mouth daily.     No current facility-administered medications for this visit.      Allergies  Allergen Reactions  . Codeine Other (See Comments)    Pt doesn't remember      Past Medical History:  Diagnosis Date  . HTN (hypertension)   . Hyperlipidemia   . Mitral regurgitation   . MVP (mitral valve prolapse)   . Near syncope   . Peptic ulcer     Past Surgical History:  Procedure Laterality Date  . CATARACT EXTRACTION Bilateral   . ELBOW SURGERY     Wired  . FOOT SURGERY     Cyst  . NECK SURGERY    .  US ECHOCARDIOGRAPHY  01-02-2002   EF 65-70%    Social History   Tobacco Use  Smoking Status Never Smoker  Smokeless Tobacco Never Used    Social History   Substance and Sexual Activity  Alcohol Use No    Family History  Problem Relation Age of Onset  . Heart failure Father   . Other Brother   . Heart attack Brother     Reviw of Systems:  Reviewed in the HPI.  All other systems are negative.  Physical Exam: Blood pressure 130/72, pulse (!) 50, height 5\' 7"  (1.702 m), weight 144 lb 9.6 oz (65.6 kg), SpO2 95 %.  GEN:  Elderly female,  Appears younger than stated age  84: Normal NECK: No JVD; No carotid bruits LYMPHATICS: No lymphadenopathy CARDIAC: RRR, no murmurs, rubs, gallops RESPIRATORY:  Clear to auscultation without rales, wheezing or rhonchi  ABDOMEN: Soft, non-tender, non-distended MUSCULOSKELETAL:  No edema; No deformity  SKIN: Warm and dry NEUROLOGIC:  Alert and oriented x 3   ECG: February 01, 2018: Sinus bradycardia at 50 beats minute.  First-degree AV block.  Nonspecific ST changes.  Assessment / Plan:   1.  Essential HTN:  Blood pressure is well controlled.  Continue current dose of Maxide and amlodipine. She has not had any episodes of chest pain, shortness breath, or near syncope.  I will see her again in 6 months.  Her lab work was reviewed and is stable. [name], MD  02/01/2018 9:40 AM    [address],  [place], [place]  [address] Pager [phone] Phone: [phone]; Fax: [phone]

## 2018-07-18 ENCOUNTER — Telehealth: Payer: Self-pay

## 2018-07-18 NOTE — Telephone Encounter (Signed)
Spoke with pt and she preferred an in office visit instead of virtual or over the phone. Pt rescheduled to 11/12/2018 at 10:40am.

## 2018-08-06 ENCOUNTER — Ambulatory Visit: Payer: Medicare HMO | Admitting: Cardiovascular Disease

## 2018-11-12 ENCOUNTER — Ambulatory Visit: Payer: Medicare HMO | Admitting: Cardiovascular Disease

## 2018-12-22 DIAGNOSIS — Z23 Encounter for immunization: Secondary | ICD-10-CM | POA: Diagnosis not present

## 2019-01-14 DIAGNOSIS — I10 Essential (primary) hypertension: Secondary | ICD-10-CM | POA: Diagnosis not present

## 2019-01-14 DIAGNOSIS — E7849 Other hyperlipidemia: Secondary | ICD-10-CM | POA: Diagnosis not present

## 2019-01-15 DIAGNOSIS — R82998 Other abnormal findings in urine: Secondary | ICD-10-CM | POA: Diagnosis not present

## 2019-01-21 DIAGNOSIS — E785 Hyperlipidemia, unspecified: Secondary | ICD-10-CM | POA: Diagnosis not present

## 2019-01-21 DIAGNOSIS — I129 Hypertensive chronic kidney disease with stage 1 through stage 4 chronic kidney disease, or unspecified chronic kidney disease: Secondary | ICD-10-CM | POA: Diagnosis not present

## 2019-01-21 DIAGNOSIS — Z0000 Encounter for general adult medical examination without abnormal findings: Secondary | ICD-10-CM | POA: Diagnosis not present

## 2019-01-21 DIAGNOSIS — M810 Age-related osteoporosis without current pathological fracture: Secondary | ICD-10-CM | POA: Diagnosis not present

## 2019-01-21 DIAGNOSIS — E041 Nontoxic single thyroid nodule: Secondary | ICD-10-CM | POA: Diagnosis not present

## 2019-01-21 DIAGNOSIS — Z1339 Encounter for screening examination for other mental health and behavioral disorders: Secondary | ICD-10-CM | POA: Diagnosis not present

## 2019-01-21 DIAGNOSIS — N1831 Chronic kidney disease, stage 3a: Secondary | ICD-10-CM | POA: Diagnosis not present

## 2019-01-21 DIAGNOSIS — Z1331 Encounter for screening for depression: Secondary | ICD-10-CM | POA: Diagnosis not present

## 2019-01-21 DIAGNOSIS — R634 Abnormal weight loss: Secondary | ICD-10-CM | POA: Diagnosis not present

## 2019-01-29 DIAGNOSIS — Z1212 Encounter for screening for malignant neoplasm of rectum: Secondary | ICD-10-CM | POA: Diagnosis not present

## 2019-04-26 ENCOUNTER — Ambulatory Visit: Payer: Medicare Other | Attending: Internal Medicine

## 2019-04-26 DIAGNOSIS — Z23 Encounter for immunization: Secondary | ICD-10-CM

## 2019-04-26 NOTE — Progress Notes (Signed)
   Covid-19 Vaccination Clinic  Name:  Michelle Gardner    MRN: [id number] DOB: Oct 31, 1924  04/26/2019  Michelle Gardner was observed post Covid-19 immunization for 15 minutes without incidence. She was provided with Vaccine Information Sheet and instruction to access the V-Safe system.   Michelle Gardner was instructed to call 911 with any severe reactions post vaccine: [name] Difficulty breathing  . Swelling of your face and throat  . A fast heartbeat  . A bad rash all over your body  . Dizziness and weakness    Immunizations Administered    Name Date Dose VIS Date Route   Pfizer COVID-19 Vaccine 04/26/2019 10:18 AM 0.3 mL 03/22/2019 Intramuscular   Manufacturer: [employer]   Lot: [id number]   [place]: [id number]

## 2019-05-17 ENCOUNTER — Ambulatory Visit: Payer: Medicare HMO | Attending: Internal Medicine

## 2019-05-17 DIAGNOSIS — Z23 Encounter for immunization: Secondary | ICD-10-CM | POA: Insufficient documentation

## 2019-05-17 NOTE — Progress Notes (Signed)
   Covid-19 Vaccination Clinic  Name:  Michelle Gardner    MRN: [id number] DOB: [birth year]  05/17/2019  Michelle Gardner was observed post Covid-19 immunization for 15 minutes without incidence. She was provided with Vaccine Information Sheet and instruction to access the V-Safe system.   Michelle Gardner was instructed to call 911 with any severe reactions post vaccine: [name] Difficulty breathing  . Swelling of your face and throat  . A fast heartbeat  . A bad rash all over your body  . Dizziness and weakness    Immunizations Administered    Name Date Dose VIS Date Route   Pfizer COVID-19 Vaccine 05/17/2019 11:15 AM 0.3 mL 03/22/2019 Intramuscular   Manufacturer: [place]   Lot: [id number]   [place]: [id number]

## 2019-08-16 IMAGING — MG DIGITAL SCREENING BILATERAL MAMMOGRAM WITH TOMO AND CAD
8 series · 8 of 24 positions shown · non-contrast
Comparison: Previous exam(s).

CLINICAL DATA: Screening.

EXAM:
DIGITAL SCREENING BILATERAL MAMMOGRAM WITH TOMO AND CAD

[R CC synth-2D]
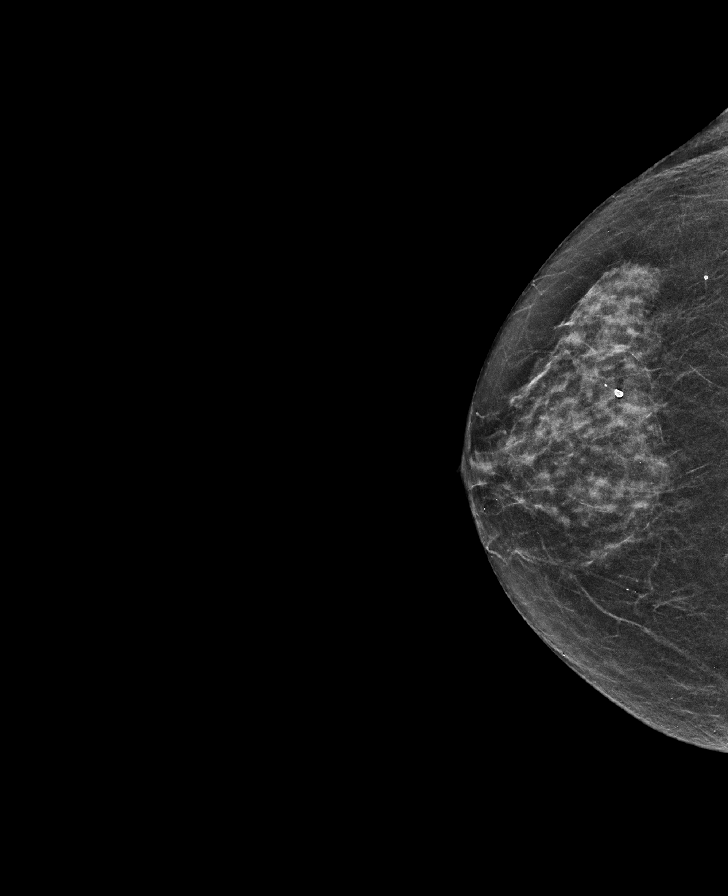

[R MLO synth-2D]
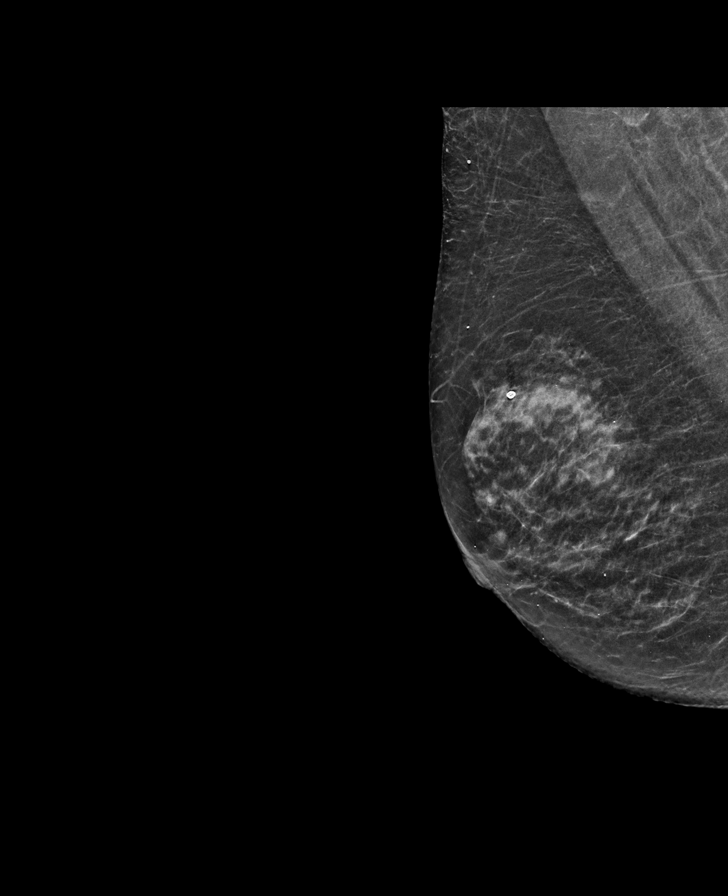

[L MLO synth-2D]
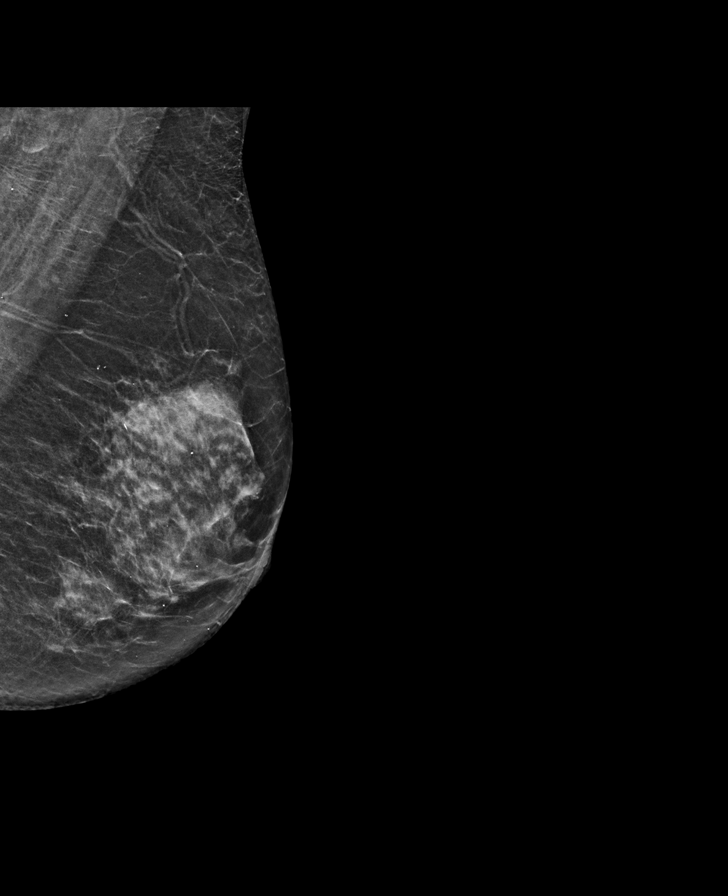

[L CC synth-2D]
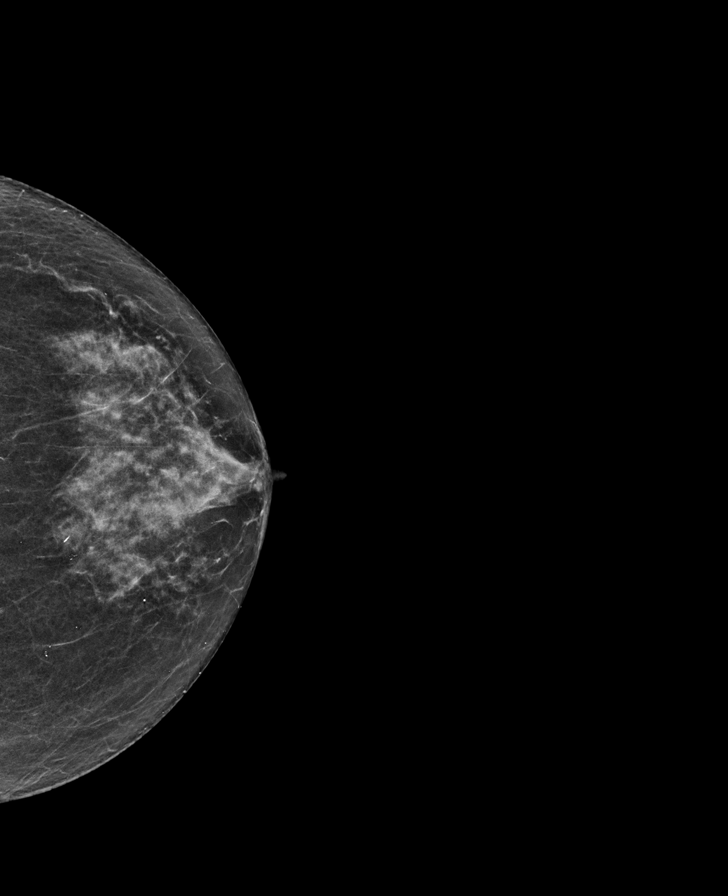

[R CC tomo · tomo slice 29/57.0]
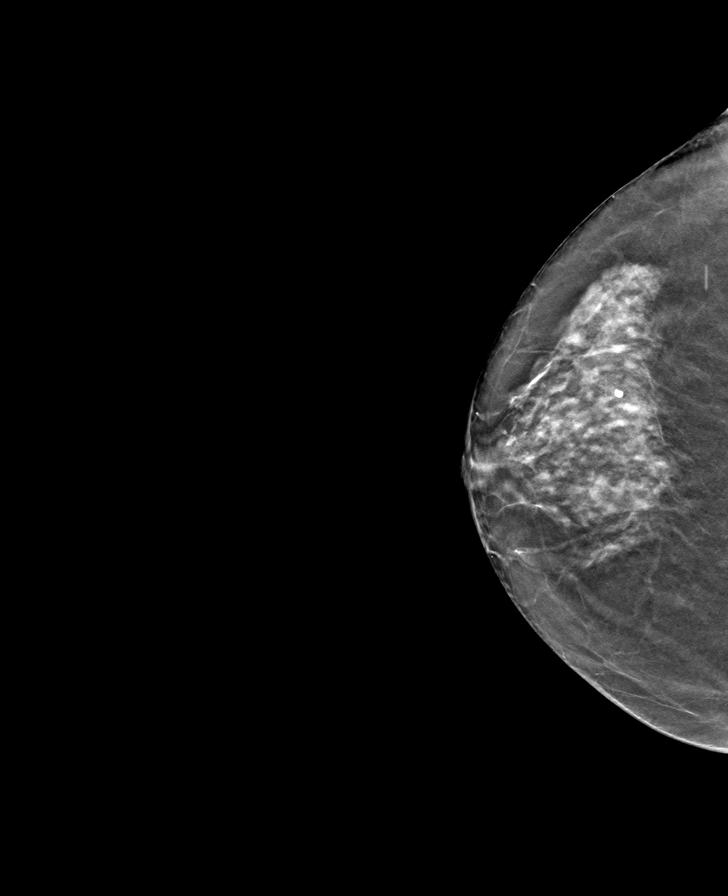

[L MLO tomo · tomo slice 31/60.0]
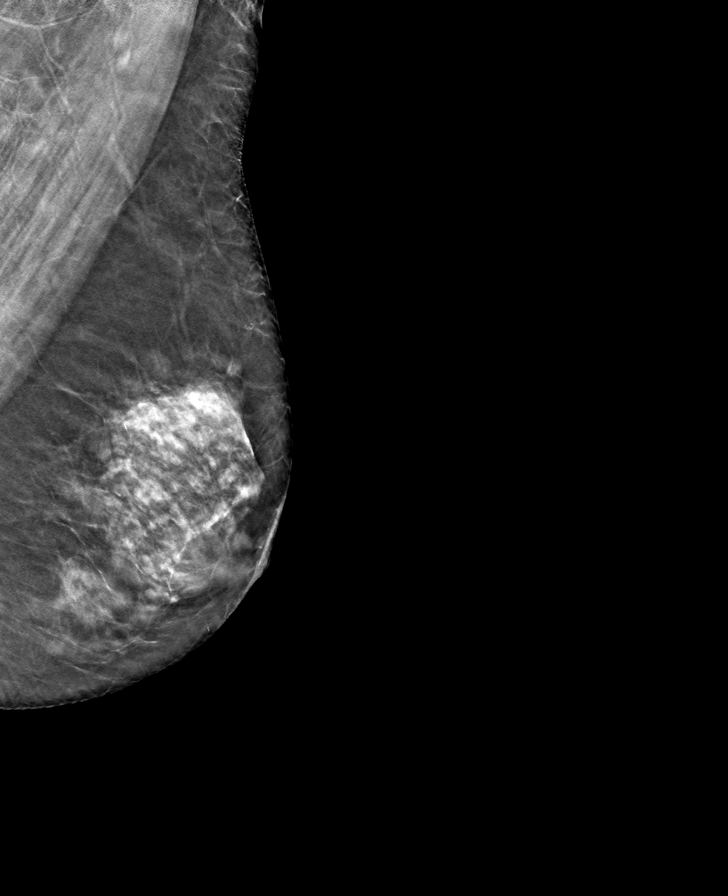

[R MLO tomo · tomo slice 28/55.0]
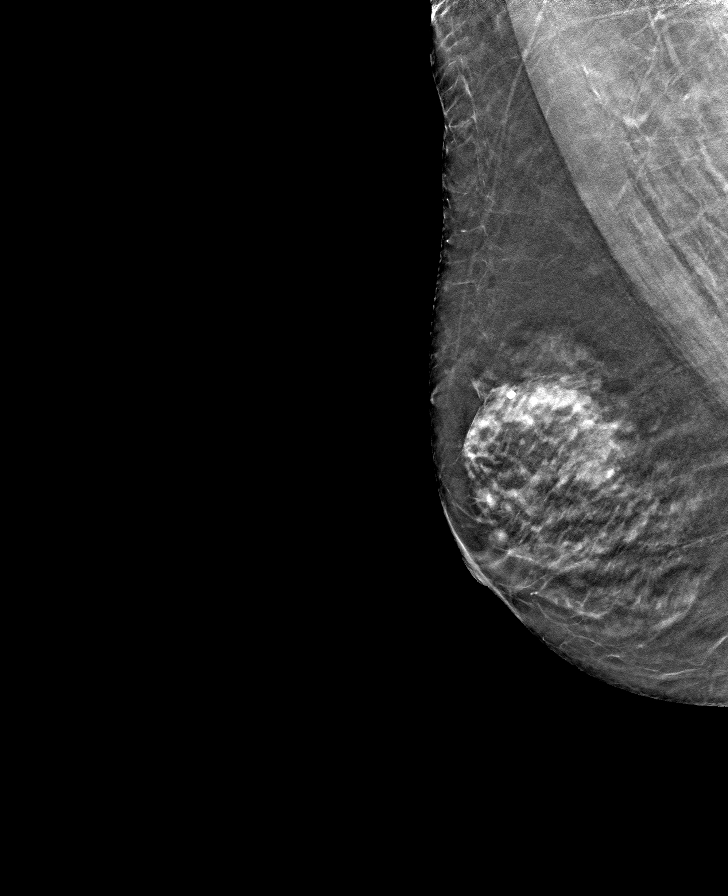

[L CC tomo · tomo slice 28/55.0]
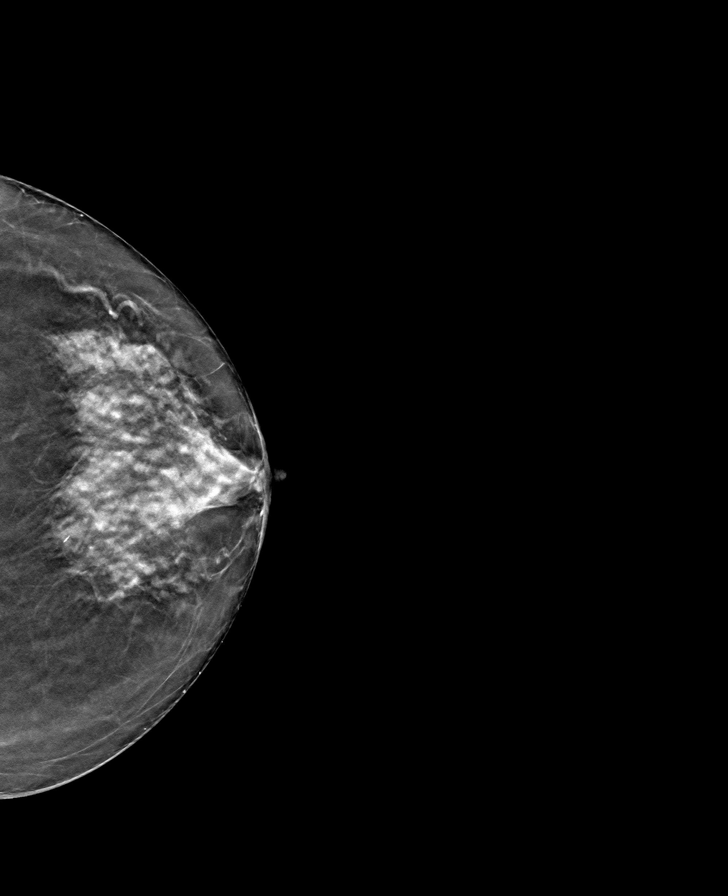

[8 of 24 positions shown; findings below may reference images not displayed]

ACR Breast Density Category c: The breast tissue is heterogeneously
dense, which may obscure small masses.
FINDINGS: There are no findings suspicious for malignancy. Images were
processed with CAD.
IMPRESSION: No mammographic evidence of malignancy. A result letter of this
screening mammogram will be mailed directly to the patient.

RECOMMENDATION:
Screening mammogram in one year. (Code:[id number])

BI-RADS CATEGORY  1: Negative.

## 2019-09-04 ENCOUNTER — Encounter: Payer: Self-pay | Admitting: Cardiovascular Disease

## 2019-09-04 ENCOUNTER — Other Ambulatory Visit: Payer: Self-pay

## 2019-09-04 ENCOUNTER — Telehealth (INDEPENDENT_AMBULATORY_CARE_PROVIDER_SITE_OTHER): Payer: Medicare HMO | Admitting: Cardiovascular Disease

## 2019-09-04 ENCOUNTER — Telehealth: Payer: Self-pay | Admitting: Cardiovascular Disease

## 2019-09-04 VITALS — BP 112/67 | HR 64 | Ht 67.0 in | Wt 147.0 lb

## 2019-09-04 DIAGNOSIS — I10 Essential (primary) hypertension: Secondary | ICD-10-CM

## 2019-09-04 MED ORDER — TRIAMTERENE-HCTZ 37.5-25 MG PO TABS: 0.5000 | ORAL_TABLET | Freq: Every day | ORAL | 3 refills | Status: DC

## 2019-09-04 MED ORDER — AMLODIPINE BESYLATE 5 MG PO TABS: 5.0000 mg | ORAL_TABLET | Freq: Every day | ORAL | 3 refills | Status: DC

## 2019-09-04 NOTE — Telephone Encounter (Signed)
Patient already had phone visit with Dr. Acie Fredrickson.

## 2019-09-04 NOTE — Telephone Encounter (Signed)
Patient's niece is calling in regards to patient's MyChart Video Visit scheduled for today, 09/04/19, at 9:40 AM with Dr. Acie Fredrickson. She states she has not yet received a call and she would like to ensure that the appointment does not need to be rescheduled. Please advise.

## 2019-09-04 NOTE — Progress Notes (Signed)
Virtual Visit via Telephone Note   This visit type was conducted due to national recommendations for restrictions regarding the COVID-19 Pandemic (e.g. social distancing) in an effort to limit this patient's exposure and mitigate transmission in our community.  Due to her co-morbid illnesses, this patient is at least at moderate risk for complications without adequate follow up.  This format is felt to be most appropriate for this patient at this time.  The patient did not have access to video technology/had technical difficulties with video requiring transitioning to audio format only (telephone).  All issues noted in this document were discussed and addressed.  No physical exam could be performed with this format.  Please refer to the patient's chart for her  consent to telehealth for [facility].   The patient was identified using 2 identifiers.  Date:  09/04/2019   ID:  Michelle Gardner, DOB 05-19-1924, MRN [id number]  Patient Location: Home Provider Location: Office  PCP:  Leanna Battles, MD  Cardiologist:  [name], MD  Electrophysiologist:  None   Evaluation Performed:  Follow-Up Visit  Chief Complaint:  HTN Problem LIst: 1. Hypertension 2. Mitral valve prolapse 3. S/p radioactive iodine  4. Sinus Bradycardia - chronic 5. Dizziness    Michelle Gardner is doing well. She has a history of hypertension and mild mitral prolapse. She's been able to do all of her normal activities without any significant problems.  Aug 11, 2015:   Michelle Gardner had an episode of lightheadedness. Happened in church,  Got very hot,  Was up in the choir, was wearing a choir robe Had eaten breakfast  Her pastor's wife [name]'s sister ) took her home  BP is still a bit a elevated. Its' time for her meds  Was seen by Dr. Sharlett Iles - he increased the amlodipine to 5 mg a day .  BP readings at home are usually pretty well controlled.   Aug. 18, 2017  Michelle Gardner is seen today  No  CP , no dizziness. Feeling well now  She had an episode of dizziness while at church.    Has had bradycardia and has pauses on the monitor during sleep .  Has never had syncope   Saw Dr. Rayann Heman  - does not think she needs a pacer at this point .   Feb. 14, 2018: Doing well BP at home is normal  Is 86 yo now.   Is doing great for 87 yo   Sept. 26, 2018: Is 89  Doing great.   BP and HR look great .  No CP or dyspnea.  [name])   July 25, 2017:  Is 89 now,  No complaints  BP has been normal at home .   No CP or dyspnea.  No dizziness  Oct. 24, 2019:  Doing well - is 86 yo Has had a slow HR for years No syncope or presyncope  (Sister has a pacer ) , mother lived to 53   Is very healthy,  Does not need a walker or cane to walk  Still lives in her own home. Does her own shopping   Sep 04, 2019      Michelle Gardner is a 86 y.o. female with History of hypertension.  We are seeing her today for telemedicine visit.  VS looks good  Still very active Does not need walker or cane Goes to church  Has had both covid vaccines.  No swelling ,  no cough,      The patient does not have symptoms concerning for COVID-19 infection (fever, chills, cough, or new shortness of breath).    Past Medical History:  Diagnosis Date  . HTN (hypertension)   . Hyperlipidemia   . Mitral regurgitation   . MVP (mitral valve prolapse)   . Near syncope   . Peptic ulcer    Past Surgical History:  Procedure Laterality Date  . CATARACT EXTRACTION Bilateral   . ELBOW SURGERY     Wired  . FOOT SURGERY     Cyst  . NECK SURGERY    . US ECHOCARDIOGRAPHY  01-02-2002   EF 65-70%     Current Meds  Medication Sig  . acetaminophen (TYLENOL) 500 MG tablet Take 250 mg by mouth at bedtime as needed (pain).  [name] amLODipine (NORVASC) 5 MG tablet Take 5 mg by mouth daily.   . Calcium Carbonate Antacid (TUMS PO) Take 1 tablet by mouth daily.   [name] CALCIUM PO Take 600 mg  by mouth daily.   . Cholecalciferol (VITAMIN D3) 10 MCG (400 UNIT) CAPS Take 1 capsule by mouth daily.  . potassium chloride (K-DUR,KLOR-CON) 10 MEQ tablet Take 10 mEq by mouth daily.  [name] triamterene-hydrochlorothiazide (MAXZIDE-25) 37.5-25 MG tablet Take 0.5 tablets by mouth daily.     Allergies:   Codeine   Social History   Tobacco Use  . Smoking status: Never Smoker  . Smokeless tobacco: Never Used  Substance Use Topics  . Alcohol use: No  . Drug use: No     Family Hx: The patient's family history includes Heart attack in her brother; Heart failure in her father; Other in her brother.  ROS:   Please see the history of present illness.     All other systems reviewed and are negative.   Prior CV studies:   The following studies were reviewed today:    Labs/Other Tests and Data Reviewed:    EKG:  No ECG reviewed.  Recent Labs: No results found for requested labs within last 8760 hours.   Recent Lipid Panel No results found for: CHOL, TRIG, HDL, CHOLHDL, LDLCALC, LDLDIRECT  Wt Readings from Last 3 Encounters:  09/04/19 147 lb (66.7 kg)  02/01/18 144 lb 9.6 oz (65.6 kg)  07/25/17 147 lb (66.7 kg)     Objective:    Vital Signs:  BP 112/67   Pulse 64   Ht 5\' 7"  (1.702 m)   Wt 147 lb (66.7 kg)   BMI 23.02 kg/m     Telephone visit .  No physical exam performed.   ASSESSMENT & PLAN:    1. HTN:   BP is well controlled.   Cont current meds.   Advised her to continue to watch her diet , limit her salt .     COVID-19 Education: The signs and symptoms of COVID-19 were discussed with the patient and how to seek care for testing (follow up with PCP or arrange E-visit).  The importance of social distancing was discussed today.  Time:   Today, I have spent  17  minutes with the patient with telehealth technology discussing the above problems.     Medication Adjustments/Labs and Tests Ordered: Current medicines are reviewed at length with the patient today.   Concerns regarding medicines are outlined above.   Tests Ordered: No orders of the defined types were placed in this encounter.   Medication Changes: No orders of the defined types were placed in this encounter.  Follow Up:  Either In Person or Virtual in 1 year(s)  Signed, [name], MD  09/04/2019 10:18 AM    [place]

## 2019-09-04 NOTE — Patient Instructions (Addendum)
Medication Instructions:  Your physician recommends that you continue on your current medications as directed. Please refer to the Current Medication list given to you today.  *If you need a refill on your cardiac medications before your next appointment, please call your pharmacy*  Lab Work: If you have labs (blood work) drawn today and your tests are completely normal, you will receive your results only by: [name] MyChart Message (if you have MyChart) OR . A paper copy in the mail If you have any lab test that is abnormal or we need to change your treatment, we will call you to review the results.  Follow-Up: At [facility], you and your health needs are our priority.  As part of our continuing mission to provide you with exceptional heart care, we have created designated Provider Care Teams.  These Care Teams include your primary Cardiologist (physician) and Advanced Practice Providers (APPs -  Physician Assistants and Nurse Practitioners) who all work together to provide you with the care you need, when you need it.  We recommend signing up for the patient portal called "MyChart".  Sign up information is provided on this After Visit Summary.  MyChart is used to connect with patients for Virtual Visits (Telemedicine).  Patients are able to view lab/test results, encounter notes, upcoming appointments, etc.  Non-urgent messages can be sent to your provider as well.   To learn more about what you can do with MyChart, go to [email or web address].    Your next appointment:   1 year(s)  The format for your next appointment:   In Person  Provider:   You may see [name], MD or one of the following Advanced Practice Providers on your designated Care Team:    Richardson Dopp, PA-C  [place], [place]

## 2019-12-22 ENCOUNTER — Emergency Department (HOSPITAL_BASED_OUTPATIENT_CLINIC_OR_DEPARTMENT_OTHER)
Admission: EM | Admit: 2019-12-22 | Discharge: 2019-12-22 | Disposition: A | Discharge status: Home / Self Care | Payer: Medicare HMO | Attending: Emergency Medicine | Admitting: Emergency Medicine

## 2019-12-22 ENCOUNTER — Other Ambulatory Visit: Payer: Self-pay

## 2019-12-22 ENCOUNTER — Encounter (HOSPITAL_BASED_OUTPATIENT_CLINIC_OR_DEPARTMENT_OTHER): Payer: Self-pay | Admitting: Emergency Medicine

## 2019-12-22 ENCOUNTER — Emergency Department (HOSPITAL_BASED_OUTPATIENT_CLINIC_OR_DEPARTMENT_OTHER): Payer: Medicare HMO

## 2019-12-22 DIAGNOSIS — R000 Tachycardia, unspecified: Secondary | ICD-10-CM | POA: Diagnosis present

## 2019-12-22 DIAGNOSIS — I4891 Unspecified atrial fibrillation: Secondary | ICD-10-CM | POA: Diagnosis not present

## 2019-12-22 DIAGNOSIS — I10 Essential (primary) hypertension: Secondary | ICD-10-CM | POA: Diagnosis not present

## 2019-12-22 DIAGNOSIS — Z79899 Other long term (current) drug therapy: Secondary | ICD-10-CM | POA: Diagnosis not present

## 2019-12-22 LAB — TSH: TSH: 2.267 u[IU]/mL (ref 0.350–4.500)

## 2019-12-22 LAB — CBC WITH DIFFERENTIAL/PLATELET
Abs Immature Granulocytes: 0.02 10*3/uL (ref 0.00–0.07)
Basophils Absolute: 0.1 10*3/uL (ref 0.0–0.1)
Basophils Relative: 1 %
Eosinophils Absolute: 0 10*3/uL (ref 0.0–0.5)
Eosinophils Relative: 0 %
HCT: 44.5 % (ref 36.0–46.0)
Hemoglobin: 14.8 g/dL (ref 12.0–15.0)
Immature Granulocytes: 0 %
Lymphocytes Relative: 28 %
Lymphs Abs: 1.9 10*3/uL (ref 0.7–4.0)
MCH: 31.6 pg (ref 26.0–34.0)
MCHC: 33.3 g/dL (ref 30.0–36.0)
MCV: 95.1 fL (ref 80.0–100.0)
Monocytes Absolute: 0.5 10*3/uL (ref 0.1–1.0)
Monocytes Relative: 7 %
Neutro Abs: 4.3 10*3/uL (ref 1.7–7.7)
Neutrophils Relative %: 64 %
Platelets: 263 10*3/uL (ref 150–400)
RBC: 4.68 MIL/uL (ref 3.87–5.11)
RDW: 13.1 % (ref 11.5–15.5)
WBC: 6.7 10*3/uL (ref 4.0–10.5)
nRBC: 0 % (ref 0.0–0.2)

## 2019-12-22 LAB — BASIC METABOLIC PANEL
Anion gap: 9 (ref 5–15)
BUN: 18 mg/dL (ref 8–23)
CO2: 26 mmol/L (ref 22–32)
Calcium: 9.9 mg/dL (ref 8.9–10.3)
Chloride: 101 mmol/L (ref 98–111)
Creatinine, Ser: 1 mg/dL (ref 0.44–1.00)
GFR calc Af Amer: 56 mL/min — ABNORMAL LOW (ref >60)
GFR calc non Af Amer: 49 mL/min — ABNORMAL LOW (ref >60)
Glucose, Bld: 126 mg/dL — ABNORMAL HIGH (ref 70–99)
Potassium: 3.6 mmol/L (ref 3.5–5.1)
Sodium: 136 mmol/L (ref 135–145)

## 2019-12-22 LAB — T4, FREE: Free T4: 0.89 ng/dL (ref 0.61–1.12)

## 2019-12-22 LAB — MAGNESIUM: Magnesium: 2 mg/dL (ref 1.7–2.4)

## 2019-12-22 MED ORDER — APIXABAN (ELIQUIS) EDUCATION KIT FOR DVT/PE PATIENTS: PACK | Freq: Once | Status: AC

## 2019-12-22 MED ORDER — METOPROLOL SUCCINATE ER 25 MG PO TB24: 12.5000 mg | ORAL_TABLET | Freq: Every day | ORAL | 0 refills | Status: DC

## 2019-12-22 MED ORDER — SODIUM CHLORIDE 0.9 % IV BOLUS
1000.0000 mL | Freq: Once | INTRAVENOUS | Status: AC
  Administered 2019-12-22: 1000 mL via INTRAVENOUS

## 2019-12-22 MED ORDER — APIXABAN 5 MG PO TABS: 5.0000 mg | ORAL_TABLET | Freq: Two times a day (BID) | ORAL | 0 refills | Status: DC

## 2019-12-22 MED ORDER — APIXABAN 2.5 MG PO TABS
ORAL_TABLET | ORAL | Status: AC
  Administered 2019-12-22: 5 mg via ORAL
  Filled 2019-12-22: qty 2

## 2019-12-22 MED ORDER — APIXABAN 2.5 MG PO TABS: 5.0000 mg | ORAL_TABLET | Freq: Two times a day (BID) | ORAL | Status: DC

## 2019-12-22 MED ORDER — APIXABAN 2.5 MG PO TABS: 5.0000 mg | ORAL_TABLET | Freq: Once | ORAL | Status: AC

## 2019-12-22 MED ORDER — METOPROLOL TARTRATE 25 MG PO TABS
12.5000 mg | ORAL_TABLET | Freq: Once | ORAL | Status: AC
  Administered 2019-12-22: 12.5 mg via ORAL
  Filled 2019-12-22: qty 1

## 2019-12-22 NOTE — ED Provider Notes (Signed)
[place] EMERGENCY DEPARTMENT Provider Note   CSN: [id number] Arrival date & time: 12/22/19  1245     History Chief Complaint  Patient presents with  . Palpitations    Michelle Gardner is a 86 y.o. female.  86 year old female with a chief complaint of an increased heart rate and high blood pressure.  This was noted on her blood pressure cuff this morning.  Denies any symptoms.  Rechecked her blood pressure multiple times without significant change.  And so called her daughter who came over and brought her to the ED.  She specifically denies any chest pain or shortness of breath denies feeling like she may pass out.  Denies cough congestion or fever.  The history is provided by the patient.  Palpitations Palpitations quality:  Irregular Onset quality:  Sudden Duration:  1 day Timing:  Constant Progression:  Unchanged Chronicity:  New Relieved by:  Nothing Worsened by:  Nothing Ineffective treatments:  None tried Associated symptoms: no chest pain, no dizziness, no nausea, no shortness of breath and no vomiting        Past Medical History:  Diagnosis Date  . HTN (hypertension)   . Hyperlipidemia   . Mitral regurgitation   . MVP (mitral valve prolapse)   . Near syncope   . Peptic ulcer     Patient Active Problem List   Diagnosis Date Noted  . Second degree AV block 08/31/2015  . Near syncope 08/11/2015  . Hypercalcemia 01/16/2013  . Nontoxic multinodular goiter 01/14/2013  . Hypertension 07/25/2011  . Mitral valve prolapse 07/25/2011    Past Surgical History:  Procedure Laterality Date  . CATARACT EXTRACTION Bilateral   . ELBOW SURGERY     Wired  . FOOT SURGERY     Cyst  . NECK SURGERY    . US ECHOCARDIOGRAPHY  01-02-2002   EF 65-70%     OB History   No obstetric history on file.     Family History  Problem Relation Age of Onset  . Heart failure Father   . Other Brother   . Heart attack Brother     Social History   Tobacco Use    . Smoking status: Never Smoker  . Smokeless tobacco: Never Used  Vaping Use  . Vaping Use: Never used  Substance Use Topics  . Alcohol use: No  . Drug use: No    Home Medications Prior to Admission medications   Medication Sig Start Date End Date Taking? Authorizing Provider  acetaminophen (TYLENOL) 500 MG tablet Take 250 mg by mouth at bedtime as needed (pain).    [provider]  amLODipine (NORVASC) 5 MG tablet Take 1 tablet (5 mg total) by mouth daily. 09/04/19   Nahser, Wonda Cheng, MD  apixaban (ELIQUIS) 5 MG TABS tablet Take 1 tablet (5 mg total) by mouth 2 (two) times daily. 12/22/19 01/21/20  Deno Etienne, DO  Calcium Carbonate Antacid (TUMS PO) Take 1 tablet by mouth daily.     [provider]  CALCIUM PO Take 600 mg by mouth daily.     [provider]  Cholecalciferol (VITAMIN D3) 10 MCG (400 UNIT) CAPS Take 1 capsule by mouth daily.    [provider]  metoprolol succinate (TOPROL-XL) 25 MG 24 hr tablet Take 0.5 tablets (12.5 mg total) by mouth daily. 12/22/19 01/21/20  Deno Etienne, DO  potassium chloride (K-DUR,KLOR-CON) 10 MEQ tablet Take 10 mEq by mouth daily.    [provider]  triamterene-hydrochlorothiazide [facility])  37.5-25 MG tablet Take 0.5 tablets by mouth daily. 09/04/19   Nahser, Wonda Cheng, MD    Allergies    Codeine  Review of Systems   Review of Systems  Constitutional: Negative for chills and fever.  HENT: Negative for congestion and rhinorrhea.   Eyes: Negative for redness and visual disturbance.  Respiratory: Negative for shortness of breath and wheezing.   Cardiovascular: Negative for chest pain and palpitations.  Gastrointestinal: Negative for nausea and vomiting.  Genitourinary: Negative for dysuria and urgency.  Musculoskeletal: Negative for arthralgias and myalgias.  Skin: Negative for pallor and wound.  Neurological: Negative for dizziness and headaches.    Physical Exam Updated Vital Signs BP (!)  132/93 (BP Location: Left Arm)   Pulse 92   Temp 97.8 F (36.6 C) (Oral)   Resp 19   Ht _0  (1.727 m)   Wt 66.7 kg   SpO2 98%   BMI 22.36 kg/m   Physical Exam Vitals and nursing note reviewed.  Constitutional:      General: She is not in acute distress.    Appearance: She is well-developed. She is not diaphoretic.  HENT:     Head: Normocephalic and atraumatic.  Eyes:     Pupils: Pupils are equal, round, and reactive to light.  Cardiovascular:     Rate and Rhythm: Normal rate. Rhythm irregular.     Heart sounds: No murmur heard.  No friction rub. No gallop.   Pulmonary:     Effort: Pulmonary effort is normal.     Breath sounds: No wheezing or rales.  Abdominal:     General: There is no distension.     Palpations: Abdomen is soft.     Tenderness: There is no abdominal tenderness.  Musculoskeletal:        General: No tenderness.     Cervical back: Normal range of motion and neck supple.  Skin:    General: Skin is warm and dry.  Neurological:     Mental Status: She is alert and oriented to person, place, and time.  Psychiatric:        Behavior: Behavior normal.     ED Results / Procedures / Treatments   Labs (all labs ordered are listed, but only abnormal results are displayed) Labs Reviewed  BASIC METABOLIC PANEL - Abnormal; Notable for the following components:      Result Value   Glucose, Bld 126 (*)    GFR calc non Af Amer 49 (*)    GFR calc Af Amer 56 (*)    All other components within normal limits  CBC WITH DIFFERENTIAL/PLATELET  MAGNESIUM  TSH  T4, FREE    EKG EKG Interpretation  Date/Time:  Sunday December 22 2019 13:38:43 EDT Ventricular Rate:  105 PR Interval:  204 QRS Duration: 95 QT Interval:  349 QTC Calculation: 462 R Axis:   36 Text Interpretation: Atrial fibrillation Borderline repol abnormality, diffuse leads Since last tracing rate slower Otherwise no significant change Confirmed by Deno Etienne [phone]) on 12/22/2019 2:51:04  PM   Radiology DG Chest Port 1 View  Result Date: 12/22/2019 CLINICAL DATA:  Atrial fibrillation EXAM: PORTABLE CHEST 1 VIEW COMPARISON:  None FINDINGS: Lungs are clear. Heart size and pulmonary vascularity are normal. No adenopathy. No bone lesions. IMPRESSION: Lungs clear.  Cardiac silhouette within normal limits. Electronically Signed   By: Lowella Grip III M.D.   On: 12/22/2019 14:33    Procedures Procedures (including critical care time)  Medications Ordered in ED  Medications  apixaban [facility]) Education Kit for DVT/PE patients (has no administration in time range)  apixaban (ELIQUIS) tablet 5 mg (has no administration in time range)  metoprolol tartrate (LOPRESSOR) tablet 12.5 mg (12.5 mg Oral Given 12/22/19 1359)  sodium chloride 0.9 % bolus 1,000 mL (1,000 mLs Intravenous New Bag/Given 12/22/19 1403)    ED Course  I have reviewed the triage vital signs and the nursing notes.  Pertinent labs & imaging results that were available during my care of the patient were reviewed by me and considered in my medical decision making (see chart for details).    MDM Rules/Calculators/A&P                          86 yo F with a chief complaints of a fast heart rate on her blood pressure monitor.  Patient checked her blood pressure as she typically did this morning.  Noted that her blood pressure was elevated.  Ended up calling her daughter who came in to see her.  Noted that her heart rate was high as well and then brought her to the ED for evaluation.  Patient's initial EKG with what appears to be sinus tachycardia that once got up to the monitor and irregularly irregular rhythm.  Heart rate mostly in the 90s.  Will obtain a laboratory evaluation.  No significant electrolyte abnormality.  Chest x-ray without signs of fluid overload.  Patient had some improvement of rate control with metoprolol.  Will discharge on metoprolol and Eliquis.  Follow-up in the A. fib clinic.  CHA2DS2/VAS  Stroke Risk Points  Current as of 5 minutes ago     4 >= 2 Points: High Risk  1 - 1.99 Points: Medium Risk  0 Points: Low Risk    No Change      Details    This score determines the patient's risk of having a stroke if the  patient has atrial fibrillation.       Points Metrics  0 Has Congestive Heart Failure:  No    Current as of 5 minutes ago  0 Has Vascular Disease:  No    Current as of 5 minutes ago  1 Has Hypertension:  Yes    Current as of 5 minutes ago  2 Age:  37    Current as of 5 minutes ago  0 Has Diabetes:  No    Current as of 5 minutes ago  0 Had Stroke:  No  Had TIA:  No  Had thromboembolism:  No    Current as of 5 minutes ago  1 Female:  Yes    Current as of 5 minutes ago          3:22 PM:  I have discussed the diagnosis/risks/treatment options with the patient and family and believe the pt to be eligible for discharge home to follow-up with Cards. We also discussed returning to the ED immediately if new or worsening sx occur. We discussed the sx which are most concerning (e.g., chest pain, sob, syncope) that necessitate immediate return. Medications administered to the patient during their visit and any new prescriptions provided to the patient are listed below.  Medications given during this visit Medications  apixaban [facility]) Education Kit for DVT/PE patients (has no administration in time range)  apixaban (ELIQUIS) tablet 5 mg (has no administration in time range)  metoprolol tartrate (LOPRESSOR) tablet 12.5 mg (12.5 mg Oral Given 12/22/19 1359)  sodium chloride 0.9 %  bolus 1,000 mL (1,000 mLs Intravenous New Bag/Given 12/22/19 1403)     The patient appears reasonably screen and/or stabilized for discharge and I doubt any other medical condition or other [facility] requiring further screening, evaluation, or treatment in the ED at this time prior to discharge.   Final Clinical Impression(s) / ED Diagnoses Final diagnoses:  Atrial fibrillation with RVR ([place])     Rx / DC Orders ED Discharge Orders         Ordered    apixaban (ELIQUIS) 5 MG TABS tablet  2 times daily        12/22/19 1511    metoprolol succinate (TOPROL-XL) 25 MG 24 hr tablet  Daily        12/22/19 1511           Deno Etienne, DO 12/22/19 1522

## 2019-12-22 NOTE — ED Triage Notes (Signed)
Patient states that she woke up this am and was having a " weird" feeling. She took her blood pressure and hr and her HR was 190 and her BP was elevated. She denies any noted Heart racing issues in the pain

## 2019-12-22 NOTE — Discharge Instructions (Signed)
Return for chest pain, shortness of breath.

## 2019-12-24 ENCOUNTER — Ambulatory Visit (HOSPITAL_COMMUNITY)
Admission: RE | Admit: 2019-12-24 | Discharge: 2019-12-24 | Disposition: A | Discharge status: Home / Self Care | Payer: Medicare HMO | Source: Ambulatory Visit | Attending: Physician Assistant | Admitting: Physician Assistant

## 2019-12-24 ENCOUNTER — Other Ambulatory Visit: Payer: Self-pay

## 2019-12-24 ENCOUNTER — Encounter (HOSPITAL_COMMUNITY): Payer: Self-pay | Admitting: Physician Assistant

## 2019-12-24 VITALS — BP 148/60 | HR 54 | Ht 68.0 in | Wt 146.4 lb

## 2019-12-24 DIAGNOSIS — Z885 Allergy status to narcotic agent status: Secondary | ICD-10-CM | POA: Insufficient documentation

## 2019-12-24 DIAGNOSIS — I480 Paroxysmal atrial fibrillation: Secondary | ICD-10-CM | POA: Insufficient documentation

## 2019-12-24 DIAGNOSIS — Z8249 Family history of ischemic heart disease and other diseases of the circulatory system: Secondary | ICD-10-CM | POA: Diagnosis not present

## 2019-12-24 DIAGNOSIS — E785 Hyperlipidemia, unspecified: Secondary | ICD-10-CM | POA: Insufficient documentation

## 2019-12-24 DIAGNOSIS — D6869 Other thrombophilia: Secondary | ICD-10-CM | POA: Diagnosis not present

## 2019-12-24 DIAGNOSIS — Z7901 Long term (current) use of anticoagulants: Secondary | ICD-10-CM | POA: Diagnosis not present

## 2019-12-24 DIAGNOSIS — Z79899 Other long term (current) drug therapy: Secondary | ICD-10-CM | POA: Insufficient documentation

## 2019-12-24 DIAGNOSIS — I10 Essential (primary) hypertension: Secondary | ICD-10-CM | POA: Insufficient documentation

## 2019-12-24 MED ORDER — METOPROLOL SUCCINATE ER 25 MG PO TB24: 12.5000 mg | ORAL_TABLET | Freq: Every day | ORAL | 0 refills | Status: AC | PRN

## 2019-12-24 MED ORDER — APIXABAN 5 MG PO TABS: 5.0000 mg | ORAL_TABLET | Freq: Two times a day (BID) | ORAL | 6 refills | Status: DC

## 2019-12-24 NOTE — Progress Notes (Signed)
Primary Care Physician: Leanna Battles, MD Primary Cardiologist: Dr Acie Fredrickson Primary Electrophysiologist: Dr Rayann Heman (remotely) Referring Physician: [facility] ED   Michelle Gardner is a 86 y.o. female with a history of HTN, MVP, and atrial fibrillation who presents for consultation in the [facility].  The patient was initially diagnosed with atrial fibrillation 12/22/19 after presenting to the ED with symptoms of increased heart rate and BP noted on a home BP machine. ECG showed afib HR 105. She did not have any symptoms associated with her arrhythmia. Patient was started on Eliquis for a CHADS2VASC score of 4 and metoprolol for rate control. She did not start the metoprolol because of her resting bradycardia. Patient denies significant alcohol use or snoring. She is back in SR.  Today, she denies symptoms of palpitations, chest pain, shortness of breath, orthopnea, PND, lower extremity edema, dizziness, presyncope, syncope, snoring, daytime somnolence, bleeding, or neurologic sequela. The patient is tolerating medications without difficulties and is otherwise without complaint today.    Atrial Fibrillation Risk Factors:  she does not have symptoms or diagnosis of sleep apnea. she does not have a history of rheumatic fever. she does not have a history of alcohol use. The patient does have a history of early familial atrial fibrillation or other arrhythmias. Mother and sister had afib.   she has a BMI of Body mass index is 22.26 kg/m.[name] Filed Weights   12/24/19 1343  Weight: 66.4 kg    Family History  Problem Relation Age of Onset  . Heart failure Father   . Other Brother   . Heart attack Brother      Atrial Fibrillation Management history:  Previous antiarrhythmic drugs: none Previous cardioversions: none Previous ablations: none CHADS2VASC score: 4 Anticoagulation history: Eliquis   Past Medical History:  Diagnosis Date  . HTN  (hypertension)   . Hyperlipidemia   . Mitral regurgitation   . MVP (mitral valve prolapse)   . Near syncope   . Peptic ulcer    Past Surgical History:  Procedure Laterality Date  . CATARACT EXTRACTION Bilateral   . ELBOW SURGERY     Wired  . FOOT SURGERY     Cyst  . NECK SURGERY    . US ECHOCARDIOGRAPHY  01-02-2002   EF 65-70%    Current Outpatient Medications  Medication Sig Dispense Refill  . acetaminophen (TYLENOL) 500 MG tablet Take 250 mg by mouth at bedtime as needed (pain).    [name] amLODipine (NORVASC) 5 MG tablet Take 1 tablet (5 mg total) by mouth daily. 90 tablet 3  . apixaban (ELIQUIS) 5 MG TABS tablet Take 1 tablet (5 mg total) by mouth 2 (two) times daily. 60 tablet 6  . Calcium Carbonate Antacid (TUMS PO) Take 1 tablet by mouth daily.     [name] CALCIUM PO Take 600 mg by mouth daily.     . Cholecalciferol (VITAMIN D3) 10 MCG (400 UNIT) CAPS Take 1 capsule by mouth daily.    . potassium chloride (K-DUR,KLOR-CON) 10 MEQ tablet Take 10 mEq by mouth daily.    [name] triamterene-hydrochlorothiazide (MAXZIDE-25) 37.5-25 MG tablet Take 0.5 tablets by mouth daily. 45 tablet 3  . metoprolol succinate (TOPROL-XL) 25 MG 24 hr tablet Take 0.5 tablets (12.5 mg total) by mouth daily as needed (heart racing). 15 tablet 0   No current facility-administered medications for this encounter.    Allergies  Allergen Reactions  . Codeine Other (See Comments)    Pt doesn't  remember      Social History   Socioeconomic History  . Marital status: Widowed    Spouse name: Not on file  . Number of children: Not on file  . Years of education: Not on file  . Highest education level: Not on file  Occupational History  . Not on file  Tobacco Use  . Smoking status: Never Smoker  . Smokeless tobacco: Never Used  Vaping Use  . Vaping Use: Never used  Substance and Sexual Activity  . Alcohol use: No  . Drug use: No  . Sexual activity: Not on file  Other Topics Concern  . Not on file  Social  History Narrative  . Not on file   Social Determinants of Health   Financial Resource Strain:   . Difficulty of Paying Living Expenses: Not on file  Food Insecurity:   . Worried About Charity fundraiser in the Last Year: Not on file  . Ran Out of Food in the Last Year: Not on file  Transportation Needs:   . Lack of Transportation (Medical): Not on file  . Lack of Transportation (Non-Medical): Not on file  Physical Activity:   . Days of Exercise per Week: Not on file  . Minutes of Exercise per Session: Not on file  Stress:   . Feeling of Stress : Not on file  Social Connections:   . Frequency of Communication with Friends and Family: Not on file  . Frequency of Social Gatherings with Friends and Family: Not on file  . Attends Religious Services: Not on file  . Active Member of Clubs or Organizations: Not on file  . Attends Archivist Meetings: Not on file  . Marital Status: Not on file  Intimate Partner Violence:   . Fear of Current or Ex-Partner: Not on file  . Emotionally Abused: Not on file  . Physically Abused: Not on file  . Sexually Abused: Not on file     ROS- All systems are reviewed and negative except as per the HPI above.  Physical Exam: Vitals:   12/24/19 1343  BP: (!) 148/60  Pulse: (!) 54  Weight: 66.4 kg  Height: 5\' 8"  (1.727 m)    GEN- The patient is well appearing elderly female, alert and oriented x 3 today.   Head- normocephalic, atraumatic Eyes-  Sclera clear, conjunctiva pink Ears- hearing intact Oropharynx- clear Neck- supple  Lungs- Clear to ausculation bilaterally, normal work of breathing Heart- Regular rate and rhythm, bradycardia, no murmurs, rubs or gallops  GI- soft, NT, ND, + BS Extremities- no clubbing, cyanosis, or edema MS- no significant deformity or atrophy Skin- no rash or lesion Psych- euthymic mood, full affect Neuro- strength and sensation are intact  Wt Readings from Last 3 Encounters:  12/24/19 66.4 kg    12/22/19 66.7 kg  09/04/19 66.7 kg    EKG today demonstrates SB HR 54, 1st degree AV block, PR 238, QRS 88, QTc 407  Echo 08/25/15 demonstrated  - Left ventricle: The cavity size was normal. Wall thickness was  normal. Systolic function was normal. The estimated ejection  fraction was in the range of 55% to 60%. Wall motion was normal;  there were no regional wall motion abnormalities. Doppler  parameters are consistent with abnormal left ventricular  relaxation (grade 1 diastolic dysfunction). Doppler parameters  are consistent with high ventricular filling pressure.  - Mitral valve: Mild prolapse, involving the anterior leaflet and  the posterior leaflet. There was mild regurgitation.  -  Left atrium: The atrium was mildly dilated.  - Pulmonary arteries: Systolic pressure was mildly increased. PA  peak pressure: 33 mm Hg (S).   Impressions:   - Normal LV systolic function; grade 1 diastolic dysfunction with  elevated LV filling pressure; mild LAE; bileaflet MVP with mild  MR; mild TR with mildly elevated pulmonary pressure.   Epic records are reviewed at length today  CHA2DS2-VASc Score = 4  The patient's score is based upon: CHF History: 0 HTN History: 1 Age : 2 Diabetes History: 0 Stroke History: 0 Vascular Disease History: 0 Gender: 1      ASSESSMENT AND PLAN: 1. Paroxysmal Atrial Fibrillation (ICD10:  I48.0) The patient's CHA2DS2-VASc score is 4, indicating a 4.8% annual risk of stroke.   General education about afib provided and questions answered. We also discussed her stroke risk and the risks and benefits of anticoagulation. Continue Eliquis 5 mg BID (weight >60 kg and Cr < 1.5).  Will not start daily AV nodal agents given resting bradycardia and h/o nocturnal 2:1 heart block. Patient may use metoprolol 12.5 mg PRN q 6 hours for heart racing.  2. Secondary Hypercoagulable State (ICD10:  D68.69) The patient is at significant risk for  stroke/thromboembolism based upon her CHA2DS2-VASc Score of 4.  Continue Apixaban (Eliquis).   3. HTN Stable, no changes today.    Follow up in the AF clinic in one month.    [facility] [address] [place], [place] [address] [phone] 12/24/2019 2:46 PM

## 2020-01-11 DIAGNOSIS — Z23 Encounter for immunization: Secondary | ICD-10-CM | POA: Diagnosis not present

## 2020-01-22 ENCOUNTER — Other Ambulatory Visit: Payer: Self-pay

## 2020-01-22 ENCOUNTER — Ambulatory Visit (HOSPITAL_COMMUNITY)
Admission: RE | Admit: 2020-01-22 | Discharge: 2020-01-22 | Disposition: A | Discharge status: Home / Self Care | Payer: Medicare HMO | Source: Ambulatory Visit | Attending: Physician Assistant | Admitting: Physician Assistant

## 2020-01-22 VITALS — BP 146/66 | HR 58 | Ht 68.0 in | Wt 145.8 lb

## 2020-01-22 DIAGNOSIS — I341 Nonrheumatic mitral (valve) prolapse: Secondary | ICD-10-CM | POA: Diagnosis not present

## 2020-01-22 DIAGNOSIS — I440 Atrioventricular block, first degree: Secondary | ICD-10-CM | POA: Insufficient documentation

## 2020-01-22 DIAGNOSIS — D6869 Other thrombophilia: Secondary | ICD-10-CM | POA: Insufficient documentation

## 2020-01-22 DIAGNOSIS — Z7901 Long term (current) use of anticoagulants: Secondary | ICD-10-CM | POA: Insufficient documentation

## 2020-01-22 DIAGNOSIS — I10 Essential (primary) hypertension: Secondary | ICD-10-CM | POA: Diagnosis not present

## 2020-01-22 DIAGNOSIS — R001 Bradycardia, unspecified: Secondary | ICD-10-CM | POA: Diagnosis not present

## 2020-01-22 DIAGNOSIS — I480 Paroxysmal atrial fibrillation: Secondary | ICD-10-CM

## 2020-01-22 DIAGNOSIS — E785 Hyperlipidemia, unspecified: Secondary | ICD-10-CM | POA: Diagnosis not present

## 2020-01-22 DIAGNOSIS — Z79899 Other long term (current) drug therapy: Secondary | ICD-10-CM | POA: Diagnosis not present

## 2020-01-22 LAB — CBC
HCT: 43 % (ref 36.0–46.0)
Hemoglobin: 13.4 g/dL (ref 12.0–15.0)
MCH: 31.3 pg (ref 26.0–34.0)
MCHC: 31.2 g/dL (ref 30.0–36.0)
MCV: 100.5 fL — ABNORMAL HIGH (ref 80.0–100.0)
Platelets: 264 10*3/uL (ref 150–400)
RBC: 4.28 MIL/uL (ref 3.87–5.11)
RDW: 13 % (ref 11.5–15.5)
WBC: 6.6 10*3/uL (ref 4.0–10.5)
nRBC: 0 % (ref 0.0–0.2)

## 2020-01-22 NOTE — Progress Notes (Signed)
Primary Care Physician: Leanna Battles, MD Primary Cardiologist: Dr Acie Fredrickson Primary Electrophysiologist: Dr Rayann Heman (remotely) Referring Physician: [facility] ED   Michelle Gardner is a 86 y.o. female with a history of HTN, MVP, and atrial fibrillation who presents for follow up in the [facility]. The patient was initially diagnosed with atrial fibrillation 12/22/19 after presenting to the ED with symptoms of increased heart rate and BP noted on a home BP machine. ECG showed afib HR 105. She did not have any symptoms associated with her arrhythmia. Patient was started on Eliquis for a CHADS2VASC score of 4 and metoprolol for rate control. She did not start the metoprolol because of her resting bradycardia. Patient denies significant alcohol use or snoring.  On follow up today, patient reports she has done very well since her last visit. She has not had any heart racing. She denies any bleeding issues on anticoagulation.   Today, she denies symptoms of palpitations, chest pain, shortness of breath, orthopnea, PND, lower extremity edema, dizziness, presyncope, syncope, snoring, daytime somnolence, bleeding, or neurologic sequela. The patient is tolerating medications without difficulties and is otherwise without complaint today.    Atrial Fibrillation Risk Factors:  she does not have symptoms or diagnosis of sleep apnea. she does not have a history of rheumatic fever. she does not have a history of alcohol use. The patient does have a history of early familial atrial fibrillation or other arrhythmias. Mother and sister had afib.   she has a BMI of Body mass index is 22.17 kg/m.[name] Filed Weights   01/22/20 1336  Weight: 66.1 kg    Family History  Problem Relation Age of Onset   Heart failure Father    Other Brother    Heart attack Brother      Atrial Fibrillation Management history:  Previous antiarrhythmic drugs: none Previous  cardioversions: none Previous ablations: none CHADS2VASC score: 4 Anticoagulation history: Eliquis   Past Medical History:  Diagnosis Date   HTN (hypertension)    Hyperlipidemia    Mitral regurgitation    MVP (mitral valve prolapse)    Near syncope    Peptic ulcer    Past Surgical History:  Procedure Laterality Date   CATARACT EXTRACTION Bilateral    ELBOW SURGERY     Wired   FOOT SURGERY     Cyst   NECK SURGERY     US ECHOCARDIOGRAPHY  01-02-2002   EF 65-70%    Current Outpatient Medications  Medication Sig Dispense Refill   acetaminophen (TYLENOL) 500 MG tablet Take 250 mg by mouth at bedtime as needed (pain).     amLODipine (NORVASC) 5 MG tablet Take 1 tablet (5 mg total) by mouth daily. 90 tablet 3   apixaban (ELIQUIS) 5 MG TABS tablet Take 1 tablet (5 mg total) by mouth 2 (two) times daily. 60 tablet 6   Calcium Carbonate Antacid (TUMS PO) Take 1 tablet by mouth daily.      CALCIUM PO Take 600 mg by mouth daily.      Cholecalciferol (VITAMIN D3) 10 MCG (400 UNIT) CAPS Take 1 capsule by mouth daily.     metoprolol succinate (TOPROL-XL) 25 MG 24 hr tablet Take 0.5 tablets (12.5 mg total) by mouth daily as needed (heart racing). 15 tablet 0   potassium chloride (K-DUR,KLOR-CON) 10 MEQ tablet Take 10 mEq by mouth daily.     triamterene-hydrochlorothiazide (MAXZIDE-25) 37.5-25 MG tablet Take 0.5 tablets by mouth daily. 45 tablet 3  No current facility-administered medications for this encounter.    Allergies  Allergen Reactions   Codeine Other (See Comments)    Pt doesn't remember      Social History   Socioeconomic History   Marital status: Widowed    Spouse name: Not on file   Number of children: Not on file   Years of education: Not on file   Highest education level: Not on file  Occupational History   Not on file  Tobacco Use   Smoking status: Never Smoker   Smokeless tobacco: Never Used  Vaping Use   Vaping Use: Never  used  Substance and Sexual Activity   Alcohol use: No   Drug use: No   Sexual activity: Not on file  Other Topics Concern   Not on file  Social History Narrative   Not on file   Social Determinants of Health   Financial Resource Strain:    Difficulty of Paying Living Expenses: Not on file  Food Insecurity:    Worried About [place] in the Last Year: Not on file   Ran Out of Food in the Last Year: Not on file  Transportation Needs:    Lack of Transportation (Medical): Not on file   Lack of Transportation (Non-Medical): Not on file  Physical Activity:    Days of Exercise per Week: Not on file   Minutes of Exercise per Session: Not on file  Stress:    Feeling of Stress : Not on file  Social Connections:    Frequency of Communication with Friends and Family: Not on file   Frequency of Social Gatherings with Friends and Family: Not on file   Attends Religious Services: Not on file   Active Member of Clubs or Organizations: Not on file   Attends Archivist Meetings: Not on file   Marital Status: Not on file  Intimate Partner Violence:    Fear of Current or Ex-Partner: Not on file   Emotionally Abused: Not on file   Physically Abused: Not on file   Sexually Abused: Not on file     ROS- All systems are reviewed and negative except as per the HPI above.  Physical Exam: Vitals:   01/22/20 1336  BP: (!) 146/66  Pulse: (!) 58  Weight: 66.1 kg  Height: 5\' 8"  (1.727 m)    GEN- The patient is well appearing elderly female, alert and oriented x 3 today.   HEENT-head normocephalic, atraumatic, sclera clear, conjunctiva pink, hearing intact, trachea midline. Lungs- Clear to ausculation bilaterally, normal work of breathing Heart- Regular rate and rhythm, no murmurs, rubs or gallops  GI- soft, NT, ND, + BS Extremities- no clubbing, cyanosis, or edema MS- no significant deformity or atrophy Skin- no rash or lesion Psych- euthymic  mood, full affect Neuro- strength and sensation are intact   Wt Readings from Last 3 Encounters:  01/22/20 66.1 kg  12/24/19 66.4 kg  12/22/19 66.7 kg    EKG today demonstrates SB HR 58, 1st degree AV block, PR 246, QRS 84, QTc 453  Echo 08/25/15 demonstrated  - Left ventricle: The cavity size was normal. Wall thickness was  normal. Systolic function was normal. The estimated ejection  fraction was in the range of 55% to 60%. Wall motion was normal;  there were no regional wall motion abnormalities. Doppler  parameters are consistent with abnormal left ventricular  relaxation (grade 1 diastolic dysfunction). Doppler parameters  are consistent with high ventricular filling pressure.  -  Mitral valve: Mild prolapse, involving the anterior leaflet and  the posterior leaflet. There was mild regurgitation.  - Left atrium: The atrium was mildly dilated.  - Pulmonary arteries: Systolic pressure was mildly increased. PA  peak pressure: 33 mm Hg (S).   Impressions:   - Normal LV systolic function; grade 1 diastolic dysfunction with  elevated LV filling pressure; mild LAE; bileaflet MVP with mild  MR; mild TR with mildly elevated pulmonary pressure.   Epic records are reviewed at length today  CHA2DS2-VASc Score = 4  The patient's score is based upon: CHF History: 0 HTN History: 1 Diabetes History: 0 Stroke History: 0 Vascular Disease History: 0      ASSESSMENT AND PLAN: 1. Paroxysmal Atrial Fibrillation (ICD10:  I48.0) The patient's CHA2DS2-VASc score is 4, indicating a 4.8% annual risk of stroke.   Patient appears to be maintaining SR. Continue Eliquis 5 mg BID (weight >60 kg and Cr < 1.5).  Will not start daily AV nodal agents given resting bradycardia and h/o nocturnal 2:1 heart block. Patient may use metoprolol 12.5 mg PRN q 6 hours for heart racing.  2. Secondary Hypercoagulable State (ICD10:  D68.69) The patient is at significant risk for  stroke/thromboembolism based upon her CHA2DS2-VASc Score of 4.  Continue Apixaban (Eliquis).   3. HTN Stable, no changes today.   Follow up in the AF clinic in 3 months.    [facility] [address] [place], [place] [address] [phone] 01/22/2020 2:19 PM

## 2020-01-27 DIAGNOSIS — E785 Hyperlipidemia, unspecified: Secondary | ICD-10-CM | POA: Diagnosis not present

## 2020-01-27 DIAGNOSIS — E041 Nontoxic single thyroid nodule: Secondary | ICD-10-CM | POA: Diagnosis not present

## 2020-01-28 DIAGNOSIS — R82998 Other abnormal findings in urine: Secondary | ICD-10-CM | POA: Diagnosis not present

## 2020-01-28 DIAGNOSIS — I10 Essential (primary) hypertension: Secondary | ICD-10-CM | POA: Diagnosis not present

## 2020-01-31 DIAGNOSIS — E041 Nontoxic single thyroid nodule: Secondary | ICD-10-CM | POA: Diagnosis not present

## 2020-01-31 DIAGNOSIS — K219 Gastro-esophageal reflux disease without esophagitis: Secondary | ICD-10-CM | POA: Diagnosis not present

## 2020-01-31 DIAGNOSIS — Z0000 Encounter for general adult medical examination without abnormal findings: Secondary | ICD-10-CM | POA: Diagnosis not present

## 2020-01-31 DIAGNOSIS — M810 Age-related osteoporosis without current pathological fracture: Secondary | ICD-10-CM | POA: Diagnosis not present

## 2020-01-31 DIAGNOSIS — I10 Essential (primary) hypertension: Secondary | ICD-10-CM | POA: Diagnosis not present

## 2020-01-31 DIAGNOSIS — I480 Paroxysmal atrial fibrillation: Secondary | ICD-10-CM | POA: Diagnosis not present

## 2020-01-31 DIAGNOSIS — L989 Disorder of the skin and subcutaneous tissue, unspecified: Secondary | ICD-10-CM | POA: Diagnosis not present

## 2020-02-08 ENCOUNTER — Ambulatory Visit: Payer: Medicare HMO | Attending: Internal Medicine

## 2020-02-08 DIAGNOSIS — Z23 Encounter for immunization: Secondary | ICD-10-CM

## 2020-02-08 NOTE — Progress Notes (Signed)
   Covid-19 Vaccination Clinic  Name:  Michelle Gardner    MRN: [id number] DOB: Oct 14, 1924  02/08/2020  Michelle Gardner was observed post Covid-19 immunization for 15 minutes without incident. She was provided with Vaccine Information Sheet and instruction to access the V-Safe system.   Michelle Gardner was instructed to call 911 with any severe reactions post vaccine: [name] Difficulty breathing  . Swelling of face and throat  . A fast heartbeat  . A bad rash all over body  . Dizziness and weakness

## 2020-02-25 DIAGNOSIS — C4441 Basal cell carcinoma of skin of scalp and neck: Secondary | ICD-10-CM | POA: Diagnosis not present

## 2020-02-25 DIAGNOSIS — D485 Neoplasm of uncertain behavior of skin: Secondary | ICD-10-CM | POA: Diagnosis not present

## 2020-02-25 DIAGNOSIS — L821 Other seborrheic keratosis: Secondary | ICD-10-CM | POA: Diagnosis not present

## 2020-02-25 DIAGNOSIS — D487 Neoplasm of uncertain behavior of other specified sites: Secondary | ICD-10-CM | POA: Diagnosis not present

## 2020-02-25 DIAGNOSIS — Z85828 Personal history of other malignant neoplasm of skin: Secondary | ICD-10-CM | POA: Diagnosis not present

## 2020-04-22 ENCOUNTER — Other Ambulatory Visit (HOSPITAL_COMMUNITY): Payer: Self-pay | Admitting: *Deleted

## 2020-04-22 ENCOUNTER — Other Ambulatory Visit: Payer: Self-pay

## 2020-04-22 ENCOUNTER — Ambulatory Visit (HOSPITAL_COMMUNITY)
Admission: RE | Admit: 2020-04-22 | Discharge: 2020-04-22 | Disposition: A | Discharge status: Home / Self Care | Payer: Medicare HMO | Source: Ambulatory Visit | Attending: Physician Assistant | Admitting: Physician Assistant

## 2020-04-22 VITALS — BP 132/66 | HR 57 | Ht 68.0 in | Wt 145.4 lb

## 2020-04-22 DIAGNOSIS — Z7901 Long term (current) use of anticoagulants: Secondary | ICD-10-CM | POA: Insufficient documentation

## 2020-04-22 DIAGNOSIS — I440 Atrioventricular block, first degree: Secondary | ICD-10-CM | POA: Diagnosis not present

## 2020-04-22 DIAGNOSIS — E785 Hyperlipidemia, unspecified: Secondary | ICD-10-CM | POA: Insufficient documentation

## 2020-04-22 DIAGNOSIS — I480 Paroxysmal atrial fibrillation: Secondary | ICD-10-CM | POA: Diagnosis not present

## 2020-04-22 DIAGNOSIS — I341 Nonrheumatic mitral (valve) prolapse: Secondary | ICD-10-CM | POA: Insufficient documentation

## 2020-04-22 DIAGNOSIS — I10 Essential (primary) hypertension: Secondary | ICD-10-CM | POA: Insufficient documentation

## 2020-04-22 DIAGNOSIS — D6869 Other thrombophilia: Secondary | ICD-10-CM | POA: Insufficient documentation

## 2020-04-22 MED ORDER — APIXABAN 5 MG PO TABS: 5.0000 mg | ORAL_TABLET | Freq: Two times a day (BID) | ORAL | 6 refills | Status: DC

## 2020-04-22 NOTE — Progress Notes (Signed)
Primary Care Physician: Jarome Matin, MD Primary Cardiologist: Dr Elease Hashimoto Primary Electrophysiologist: Dr Johney Frame (remotely) Referring Physician: [facility] ED   Michelle Gardner is a 87 y.o. female with a history of HTN, MVP, and atrial fibrillation who presents for follow up in the [facility]. The patient was initially diagnosed with atrial fibrillation 12/22/19 after presenting to the ED with symptoms of increased heart rate and BP noted on a home BP machine. ECG showed afib HR 105. She did not have any symptoms associated with her arrhythmia. Patient was started on Eliquis for a CHADS2VASC score of 4 and metoprolol for rate control. She did not start the metoprolol because of her resting bradycardia. Patient denies significant alcohol use or snoring.  On follow up today, patient reports she has done very well since her last visit. She denies any heart racing or palpitations. She has not noticed any increased heart rates on her home BP machine. She denies any bleeding issues on anticoagulation.   Today, she denies symptoms of palpitations, chest pain, shortness of breath, orthopnea, PND, lower extremity edema, dizziness, presyncope, syncope, snoring, daytime somnolence, bleeding, or neurologic sequela. The patient is tolerating medications without difficulties and is otherwise without complaint today.    Atrial Fibrillation Risk Factors:  she does not have symptoms or diagnosis of sleep apnea. she does not have a history of rheumatic fever. she does not have a history of alcohol use. The patient does have a history of early familial atrial fibrillation or other arrhythmias. Mother and sister had afib.   she has a BMI of Body mass index is 22.11 kg/m.[name] Filed Weights   04/22/20 1346  Weight: 66 kg    Family History  Problem Relation Age of Onset  . Heart failure Father   . Other Brother   . Heart attack Brother      Atrial Fibrillation  Management history:  Previous antiarrhythmic drugs: none Previous cardioversions: none Previous ablations: none CHADS2VASC score: 4 Anticoagulation history: Eliquis   Past Medical History:  Diagnosis Date  . HTN (hypertension)   . Hyperlipidemia   . Mitral regurgitation   . MVP (mitral valve prolapse)   . Near syncope   . Peptic ulcer    Past Surgical History:  Procedure Laterality Date  . CATARACT EXTRACTION Bilateral   . ELBOW SURGERY     Wired  . FOOT SURGERY     Cyst  . NECK SURGERY    . US ECHOCARDIOGRAPHY  01-02-2002   EF 65-70%    Current Outpatient Medications  Medication Sig Dispense Refill  . acetaminophen (TYLENOL) 500 MG tablet Take 250 mg by mouth at bedtime as needed (pain).    [name] amLODipine (NORVASC) 5 MG tablet Take 1 tablet (5 mg total) by mouth daily. 90 tablet 3  . apixaban (ELIQUIS) 5 MG TABS tablet Take 1 tablet (5 mg total) by mouth 2 (two) times daily. 60 tablet 6  . Calcium Carbonate Antacid (TUMS PO) Take 1 tablet by mouth daily.     [name] CALCIUM PO Take 600 mg by mouth daily.     . Cholecalciferol (VITAMIN D3) 10 MCG (400 UNIT) CAPS Take 1 capsule by mouth daily.    . metoprolol succinate (TOPROL-XL) 25 MG 24 hr tablet Take 0.5 tablets (12.5 mg total) by mouth daily as needed (heart racing). 15 tablet 0  . potassium chloride (K-DUR,KLOR-CON) 10 MEQ tablet Take 10 mEq by mouth daily.    [name] triamterene-hydrochlorothiazide (MAXZIDE-25)  37.5-25 MG tablet Take 0.5 tablets by mouth daily. 45 tablet 3  . VITAMIN D PO Take by mouth. Taking one tablet by mouth daily     No current facility-administered medications for this encounter.    Allergies  Allergen Reactions  . Codeine Other (See Comments)    Pt doesn't remember      Social History   Socioeconomic History  . Marital status: Widowed    Spouse name: Not on file  . Number of children: Not on file  . Years of education: Not on file  . Highest education level: Not on file  Occupational  History  . Not on file  Tobacco Use  . Smoking status: Never Smoker  . Smokeless tobacco: Never Used  Vaping Use  . Vaping Use: Never used  Substance and Sexual Activity  . Alcohol use: No  . Drug use: No  . Sexual activity: Not on file  Other Topics Concern  . Not on file  Social History Narrative  . Not on file   Social Determinants of Health   Financial Resource Strain: Not on file  Food Insecurity: Not on file  Transportation Needs: Not on file  Physical Activity: Not on file  Stress: Not on file  Social Connections: Not on file  Intimate Partner Violence: Not on file     ROS- All systems are reviewed and negative except as per the HPI above.  Physical Exam: Vitals:   04/22/20 1346  BP: 132/66  Pulse: (!) 57  Weight: 66 kg  Height: 5\' 8"  (1.727 m)    GEN- The patient is well appearing elderly female, alert and oriented x 3 today.   HEENT-head normocephalic, atraumatic, sclera clear, conjunctiva pink, hearing intact, trachea midline. Lungs- Clear to ausculation bilaterally, normal work of breathing Heart- Regular rate and rhythm, no murmurs, rubs or gallops  GI- soft, NT, ND, + BS Extremities- no clubbing, cyanosis, or edema MS- no significant deformity or atrophy Skin- no rash or lesion Psych- euthymic mood, full affect Neuro- strength and sensation are intact   Wt Readings from Last 3 Encounters:  04/22/20 66 kg  01/22/20 66.1 kg  12/24/19 66.4 kg    EKG today demonstrates  SB 1st degree AV block Vent. rate 57 BPM PR interval 242 ms QRS duration 86 ms QT/QTc 430/418 ms  Echo 08/25/15 demonstrated  - Left ventricle: The cavity size was normal. Wall thickness was  normal. Systolic function was normal. The estimated ejection  fraction was in the range of 55% to 60%. Wall motion was normal;  there were no regional wall motion abnormalities. Doppler  parameters are consistent with abnormal left ventricular  relaxation (grade 1 diastolic  dysfunction). Doppler parameters  are consistent with high ventricular filling pressure.  - Mitral valve: Mild prolapse, involving the anterior leaflet and  the posterior leaflet. There was mild regurgitation.  - Left atrium: The atrium was mildly dilated.  - Pulmonary arteries: Systolic pressure was mildly increased. PA  peak pressure: 33 mm Hg (S).   Impressions:   - Normal LV systolic function; grade 1 diastolic dysfunction with  elevated LV filling pressure; mild LAE; bileaflet MVP with mild  MR; mild TR with mildly elevated pulmonary pressure.   Epic records are reviewed at length today  CHA2DS2-VASc Score = 4  The patient's score is based upon: CHF History: No HTN History: Yes Diabetes History: No Stroke History: No Vascular Disease History: No      ASSESSMENT AND PLAN: 1. Paroxysmal  Atrial Fibrillation (ICD10:  I48.0) The patient's CHA2DS2-VASc score is 4, indicating a 4.8% annual risk of stroke.   Patient appears to be maintaining SR. Continue Eliquis 5 mg BID (weight >60 kg and Cr < 1.5).  Will not start daily AV nodal agents given resting bradycardia and h/o nocturnal 2:1 heart block. Patient may use metoprolol 12.5 mg PRN q 6 hours for heart racing.  2. Secondary Hypercoagulable State (ICD10:  D68.69) The patient is at significant risk for stroke/thromboembolism based upon her CHA2DS2-VASc Score of 4.  Continue Apixaban (Eliquis).   3. HTN Stable, no changes today.   Follow up with Dr Acie Fredrickson per recall. AF clinic in one year.    [facility] [address] [place],  [address] [phone] 04/22/2020 2:10 PM

## 2020-08-03 ENCOUNTER — Other Ambulatory Visit (HOSPITAL_BASED_OUTPATIENT_CLINIC_OR_DEPARTMENT_OTHER): Payer: Self-pay

## 2020-08-03 ENCOUNTER — Ambulatory Visit: Payer: Medicare HMO | Attending: Internal Medicine

## 2020-08-03 DIAGNOSIS — Z23 Encounter for immunization: Secondary | ICD-10-CM

## 2020-08-03 MED ORDER — PFIZER-BIONT COVID-19 VAC-TRIS 30 MCG/0.3ML IM SUSP
INTRAMUSCULAR | 0 refills | Status: DC
  Filled 2020-08-03: qty 0.3, 1d supply, fill #0

## 2020-08-03 NOTE — Progress Notes (Signed)
   Covid-19 Vaccination Clinic  Name:  Michelle Gardner    MRN: [id number] DOB: 01-23-1925  08/03/2020  Ms. Michelle Gardner was observed post Covid-19 immunization for 15 minutes without incident. She was provided with Vaccine Information Sheet and instruction to access the V-Safe system.   Ms. Michelle Gardner was instructed to call 911 with any severe reactions post vaccine: [name] Difficulty breathing  . Swelling of face and throat  . A fast heartbeat  . A bad rash all over body  . Dizziness and weakness   Immunizations Administered    Name Date Dose VIS Date Route   PFIZER Comrnaty(Gray TOP) Covid-19 Vaccine 08/03/2020  1:46 PM 0.3 mL 03/19/2020 Intramuscular   Manufacturer: [place]   Lot: [id number]   NDC: [phone]

## 2020-08-06 ENCOUNTER — Ambulatory Visit: Payer: Medicare HMO

## 2020-08-07 ENCOUNTER — Ambulatory Visit: Payer: Medicare HMO

## 2020-08-24 DIAGNOSIS — L821 Other seborrheic keratosis: Secondary | ICD-10-CM | POA: Diagnosis not present

## 2020-08-24 DIAGNOSIS — Z85828 Personal history of other malignant neoplasm of skin: Secondary | ICD-10-CM | POA: Diagnosis not present

## 2020-08-24 DIAGNOSIS — L738 Other specified follicular disorders: Secondary | ICD-10-CM | POA: Diagnosis not present

## 2020-10-21 ENCOUNTER — Ambulatory Visit: Payer: Medicare HMO | Admitting: Cardiovascular Disease

## 2020-10-28 ENCOUNTER — Encounter: Payer: Self-pay | Admitting: Cardiovascular Disease

## 2020-10-28 ENCOUNTER — Ambulatory Visit: Payer: Medicare HMO | Admitting: Cardiovascular Disease

## 2020-10-28 ENCOUNTER — Other Ambulatory Visit: Payer: Self-pay

## 2020-10-28 VITALS — BP 144/78 | HR 59 | Ht 68.0 in | Wt 141.2 lb

## 2020-10-28 DIAGNOSIS — I480 Paroxysmal atrial fibrillation: Secondary | ICD-10-CM | POA: Diagnosis not present

## 2020-10-28 DIAGNOSIS — I10 Essential (primary) hypertension: Secondary | ICD-10-CM | POA: Diagnosis not present

## 2020-10-28 NOTE — Patient Instructions (Signed)

## 2020-10-28 NOTE — Progress Notes (Signed)
Date:  10/28/2020   ID:  Michelle Gardner, DOB 01-Jul-1924, MRN [id number]  Patient Location: Home Provider Location: Office  PCP:  Leanna Battles, MD  Cardiologist:  [name], MD  Electrophysiologist:  None   Evaluation Performed:  Follow-Up Visit  Chief Complaint:  HTN Problem LIst: 1. Hypertension 2. Mitral valve prolapse 3. S/p radioactive iodine  4. Sinus Bradycardia - chronic 5. Dizziness   6.  Paroxysmal atrial fib    Michelle Gardner is doing well. She has a history of hypertension and mild mitral prolapse. She's been able to do all of her normal activities without any significant problems.   Aug 11, 2015:    Michelle Gardner had an episode of lightheadedness. Happened in church,  Got very hot,  Was up in the choir, was wearing a choir robe Had eaten breakfast  Her pastor's wife Michelle Gardner's sister ) took her home  BP is still a bit a elevated. Its' time for her meds  Was seen by Dr. Sharlett Iles - he increased the amlodipine to 5 mg a day .   BP readings at home are usually pretty well controlled.    Aug. 18, 2017   Michelle Gardner is seen today  No CP , no dizziness. Feeling well now   She had an episode of dizziness while at church.    Has had bradycardia and has pauses on the monitor during sleep .  Has never had syncope    Saw Dr. Rayann Heman  - does not think she needs a pacer at this point .    Feb. 14, 2018: Doing well BP at home is normal  Is 87 yo now.   Is doing great for 87 yo    Sept. 26, 2018: Is 89  Doing great.   BP and HR look great .  No CP or dyspnea.  Michelle Gardner of Michelle Gardner)    July 25, 2017:   Is 89 now,  No complaints  BP has been normal at home .   No CP or dyspnea.  No dizziness   Oct. 24, 2019:   Doing well - is 87 yo Has had a slow HR for years No syncope or presyncope  (Sister has a pacer ) , mother lived to 40    Is very healthy,  Does not need a walker or cane to walk  Still lives in her own home. Does her  own shopping   Sep 04, 2019      [name] is a 87 y.o. female with History of hypertension.  We are seeing her today for telemedicine visit.  VS looks good  Still very active Does not need walker or cane Goes to church  Has had both covid vaccines.  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   amLODipine (NORVASC) 5 MG tablet Take 1 tablet (5 mg total) by mouth daily.   apixaban (ELIQUIS) 5 MG TABS tablet Take 1 tablet (5 mg total) by mouth 2 (two) times daily.   Calcium Carbonate Antacid (TUMS PO) Take 1 tablet by mouth daily.    CALCIUM PO Take 600 mg by mouth daily.    Cholecalciferol (VITAMIN D3) 10 MCG (400 UNIT) CAPS Take 1 capsule by mouth daily.   COVID-19 mRNA Vac-TriS, Pfizer, (PFIZER-BIONT COVID-19 VAC-TRIS) SUSP injection Inject into the muscle.   metoprolol succinate (TOPROL-XL) 25 MG 24 hr tablet Take 0.5 tablets (12.5 mg total) by mouth daily as needed (heart racing).   potassium chloride (K-DUR,KLOR-CON) 10 MEQ tablet Take 10 mEq by mouth daily.   triamterene-hydrochlorothiazide (MAXZIDE-25) 37.5-25 MG tablet Take 0.5 tablets by mouth daily.   VITAMIN D PO Take by mouth. Taking one tablet by mouth daily     Allergies:   Codeine   Social History   Tobacco Use   Smoking status:  Never   Smokeless tobacco: Never  Vaping Use   Vaping Use: Never used  Substance Use Topics   Alcohol use: No   Drug use: No     Family Hx: The patient's family history includes Heart attack in her brother; Heart failure in her father; Other in her brother.  ROS:   Please see the history of present illness.     All other systems reviewed and are negative.   Prior CV studies:   The following studies were reviewed today:    Labs/Other Tests and Data Reviewed:    EKG:  No ECG reviewed.  Recent Labs: 12/22/2019: BUN 18; Creatinine, Ser 1.00; Magnesium 2.0; Potassium 3.6; Sodium 136; TSH 2.267 01/22/2020: Hemoglobin 13.4; Platelets 264   Recent Lipid Panel No results found for: CHOL, TRIG, HDL, CHOLHDL, LDLCALC, LDLDIRECT  Wt Readings from Last 3 Encounters:  10/28/20 141 lb 3.2 oz (64 kg)  04/22/20 145 lb 6.4 oz (66 kg)  01/22/20 145 lb 12.8 oz (66.1 kg)     Objective:    Vital Signs:  BP (!) 144/78   Pulse (!) 59   Ht 5\' 8"  (1.727 m)   Wt 141 lb 3.2 oz (64 kg)   SpO2 98%   BMI 21.47 kg/m     Physical Exam: Blood pressure (!) 144/78, pulse (!) 59, height 5\' 8"  (1.727 m), weight 141 lb 3.2 oz (64 kg), SpO2 98 %.  GEN:  elderly female,  NAD  HEENT: Normal NECK: No JVD; No carotid bruits LYMPHATICS: No lymphadenopathy CARDIAC: RRR , no murmurs, rubs, gallops RESPIRATORY:  Clear to auscultation without rales, wheezing or rhonchi  ABDOMEN: Soft, non-tender, non-distended MUSCULOSKELETAL:  No edema; No deformity  SKIN: Warm and dry NEUROLOGIC:  Alert and oriented x 3   ASSESSMENT & PLAN:    HTN:   BP is well controlled.   Cont current meds.   Advised her to continue to watch her diet , limit her salt .    2.  PAF:   Had an episode of paroxysmal atrial fibrillation.  She took a dose of metoprolol XL and converted to sinus rhythm.  Now she takes metoprolol succinate on an as-needed basis.  Continue Eliquis.  She overall seems to be doing well.  Medication  Adjustments/Labs and Tests Ordered: Current medicines are reviewed at length with the patient today.  Concerns regarding medicines are outlined above.   Tests Ordered: No orders of the defined types were placed in this encounter.   Medication Changes: No orders of the defined types were placed in this encounter.   Follow Up:    Signed, [name], MD  10/28/2020 11:48 AM    [place]

## 2020-12-07 ENCOUNTER — Other Ambulatory Visit (HOSPITAL_BASED_OUTPATIENT_CLINIC_OR_DEPARTMENT_OTHER): Payer: Self-pay

## 2021-01-16 DIAGNOSIS — Z23 Encounter for immunization: Secondary | ICD-10-CM | POA: Diagnosis not present

## 2021-01-27 ENCOUNTER — Other Ambulatory Visit (HOSPITAL_COMMUNITY): Payer: Self-pay | Admitting: Physician Assistant

## 2021-02-10 ENCOUNTER — Ambulatory Visit: Payer: Medicare HMO | Attending: Internal Medicine

## 2021-02-10 ENCOUNTER — Other Ambulatory Visit (HOSPITAL_BASED_OUTPATIENT_CLINIC_OR_DEPARTMENT_OTHER): Payer: Self-pay

## 2021-02-10 DIAGNOSIS — Z23 Encounter for immunization: Secondary | ICD-10-CM

## 2021-02-10 MED ORDER — PFIZER COVID-19 VAC BIVALENT 30 MCG/0.3ML IM SUSP
INTRAMUSCULAR | 0 refills | Status: DC
  Filled 2021-02-10: qty 0.3, 1d supply, fill #0

## 2021-02-10 NOTE — Progress Notes (Signed)
   Covid-19 Vaccination Clinic  Name:  Michelle Gardner    MRN: [id number] DOB: Feb 07, 1925  02/10/2021  [name] was observed post Covid-19 immunization for 15 minutes without incident. She was provided with Vaccine Information Sheet and instruction to access the V-Safe system.   [name] was instructed to call 911 with any severe reactions post vaccine: Difficulty breathing  Swelling of face and throat  A fast heartbeat  A bad rash all over body  Dizziness and weakness   Immunizations Administered     Name Date Dose VIS Date Route   Pfizer Covid-19 Vaccine Bivalent Booster 02/10/2021  1:40 PM 0.3 mL 12/09/2020 Intramuscular   Manufacturer: [place]   Lot: [id number]   [place]: [phone]

## 2021-02-19 DIAGNOSIS — E785 Hyperlipidemia, unspecified: Secondary | ICD-10-CM | POA: Diagnosis not present

## 2021-02-19 DIAGNOSIS — M810 Age-related osteoporosis without current pathological fracture: Secondary | ICD-10-CM | POA: Diagnosis not present

## 2021-02-19 DIAGNOSIS — E041 Nontoxic single thyroid nodule: Secondary | ICD-10-CM | POA: Diagnosis not present

## 2021-02-19 DIAGNOSIS — I10 Essential (primary) hypertension: Secondary | ICD-10-CM | POA: Diagnosis not present

## 2021-02-26 DIAGNOSIS — I10 Essential (primary) hypertension: Secondary | ICD-10-CM | POA: Diagnosis not present

## 2021-02-26 DIAGNOSIS — I480 Paroxysmal atrial fibrillation: Secondary | ICD-10-CM | POA: Diagnosis not present

## 2021-02-26 DIAGNOSIS — E041 Nontoxic single thyroid nodule: Secondary | ICD-10-CM | POA: Diagnosis not present

## 2021-02-26 DIAGNOSIS — Z0000 Encounter for general adult medical examination without abnormal findings: Secondary | ICD-10-CM | POA: Diagnosis not present

## 2021-02-26 DIAGNOSIS — E785 Hyperlipidemia, unspecified: Secondary | ICD-10-CM | POA: Diagnosis not present

## 2021-04-22 ENCOUNTER — Ambulatory Visit (HOSPITAL_COMMUNITY)
Admission: RE | Admit: 2021-04-22 | Discharge: 2021-04-22 | Disposition: A | Discharge status: Home / Self Care | Payer: Medicare HMO | Source: Ambulatory Visit | Attending: Physician Assistant | Admitting: Physician Assistant

## 2021-04-22 ENCOUNTER — Other Ambulatory Visit: Payer: Self-pay

## 2021-04-22 ENCOUNTER — Encounter (HOSPITAL_COMMUNITY): Payer: Self-pay | Admitting: Physician Assistant

## 2021-04-22 VITALS — BP 132/62 | HR 54 | Ht 68.0 in | Wt 139.6 lb

## 2021-04-22 DIAGNOSIS — I341 Nonrheumatic mitral (valve) prolapse: Secondary | ICD-10-CM | POA: Diagnosis not present

## 2021-04-22 DIAGNOSIS — I10 Essential (primary) hypertension: Secondary | ICD-10-CM | POA: Insufficient documentation

## 2021-04-22 DIAGNOSIS — D6869 Other thrombophilia: Secondary | ICD-10-CM | POA: Insufficient documentation

## 2021-04-22 DIAGNOSIS — Z7901 Long term (current) use of anticoagulants: Secondary | ICD-10-CM | POA: Diagnosis not present

## 2021-04-22 DIAGNOSIS — I480 Paroxysmal atrial fibrillation: Secondary | ICD-10-CM | POA: Insufficient documentation

## 2021-04-22 LAB — CBC
HCT: 41.7 % (ref 36.0–46.0)
Hemoglobin: 13.2 g/dL (ref 12.0–15.0)
MCH: 31.4 pg (ref 26.0–34.0)
MCHC: 31.7 g/dL (ref 30.0–36.0)
MCV: 99.3 fL (ref 80.0–100.0)
Platelets: 292 10*3/uL (ref 150–400)
RBC: 4.2 MIL/uL (ref 3.87–5.11)
RDW: 12.9 % (ref 11.5–15.5)
WBC: 6.8 10*3/uL (ref 4.0–10.5)
nRBC: 0 % (ref 0.0–0.2)

## 2021-04-22 LAB — BASIC METABOLIC PANEL
Anion gap: 9 (ref 5–15)
BUN: 19 mg/dL (ref 8–23)
CO2: 28 mmol/L (ref 22–32)
Calcium: 10 mg/dL (ref 8.9–10.3)
Chloride: 103 mmol/L (ref 98–111)
Creatinine, Ser: 1.17 mg/dL — ABNORMAL HIGH (ref 0.44–1.00)
GFR, Estimated: 43 mL/min — ABNORMAL LOW (ref >60)
Glucose, Bld: 118 mg/dL — ABNORMAL HIGH (ref 70–99)
Potassium: 3.7 mmol/L (ref 3.5–5.1)
Sodium: 140 mmol/L (ref 135–145)

## 2021-04-22 NOTE — Progress Notes (Signed)
Primary Care Physician: Donnajean Lopes, MD Primary Cardiologist: Dr Acie Fredrickson Primary Electrophysiologist: Dr Rayann Heman (remotely) Referring Physician: [facility] ED   Michelle Gardner is a 88 y.o. female with a history of HTN, MVP, and atrial fibrillation who presents for follow up in the [facility]. The patient was initially diagnosed with atrial fibrillation 12/22/19 after presenting to the ED with symptoms of increased heart rate and BP noted on a home BP machine. ECG showed afib HR 105. She did not have any symptoms associated with her arrhythmia. Patient was started on Eliquis for a CHADS2VASC score of 4 and metoprolol for rate control. She did not start the metoprolol because of her resting bradycardia. Patient denies significant alcohol use or snoring.  On follow up today, patient reports that she has done very well since her last visit. She denies any high heart rates or BP readings. No bleeding issues on anticoagulation.   Today, she denies symptoms of palpitations, chest pain, shortness of breath, orthopnea, PND, lower extremity edema, dizziness, presyncope, syncope, snoring, daytime somnolence, bleeding, or neurologic sequela. The patient is tolerating medications without difficulties and is otherwise without complaint today.    Atrial Fibrillation Risk Factors:  she does not have symptoms or diagnosis of sleep apnea. she does not have a history of rheumatic fever. she does not have a history of alcohol use. The patient does have a history of early familial atrial fibrillation or other arrhythmias. Mother and sister had afib.   she has a BMI of Body mass index is 21.23 kg/m.[name] Filed Weights   04/22/21 1433  Weight: 63.3 kg     Family History  Problem Relation Age of Onset   Heart failure Father    Other Brother    Heart attack Brother      Atrial Fibrillation Management history:  Previous antiarrhythmic drugs: none Previous  cardioversions: none Previous ablations: none CHADS2VASC score: 4 Anticoagulation history: Eliquis   Past Medical History:  Diagnosis Date   HTN (hypertension)    Hyperlipidemia    Mitral regurgitation    MVP (mitral valve prolapse)    Near syncope    Peptic ulcer    Past Surgical History:  Procedure Laterality Date   CATARACT EXTRACTION Bilateral    ELBOW SURGERY     Wired   FOOT SURGERY     Cyst   NECK SURGERY     US ECHOCARDIOGRAPHY  01-02-2002   EF 65-70%    Current Outpatient Medications  Medication Sig Dispense Refill   acetaminophen (TYLENOL) 500 MG tablet Take 250 mg by mouth at bedtime as needed (pain).     amLODipine (NORVASC) 5 MG tablet Take 1 tablet (5 mg total) by mouth daily. 90 tablet 3   apixaban (ELIQUIS) 5 MG TABS tablet Take 1 tablet (5 mg total) by mouth 2 (two) times daily. Appointment Required For Further Refills [phone] 180 tablet 1   Calcium Carbonate Antacid (TUMS PO) Take 1 tablet by mouth daily.      CALCIUM PO Take 600 mg by mouth daily.      Cholecalciferol (VITAMIN D3) 10 MCG (400 UNIT) CAPS Take 1 capsule by mouth daily.     COVID-19 mRNA bivalent vaccine, Pfizer, (PFIZER COVID-19 VAC BIVALENT) injection Inject into the muscle. 0.3 mL 0   COVID-19 mRNA Vac-TriS, Pfizer, (PFIZER-BIONT COVID-19 VAC-TRIS) SUSP injection Inject into the muscle. 0.3 mL 0   metoprolol succinate (TOPROL-XL) 25 MG 24 hr tablet Take 0.5 tablets (  12.5 mg total) by mouth daily as needed (heart racing). 15 tablet 0   potassium chloride (K-DUR,KLOR-CON) 10 MEQ tablet Take 10 mEq by mouth daily.     triamterene-hydrochlorothiazide (MAXZIDE-25) 37.5-25 MG tablet Take 0.5 tablets by mouth daily. 45 tablet 3   VITAMIN D PO Take by mouth. Taking one tablet by mouth daily     No current facility-administered medications for this encounter.    Allergies  Allergen Reactions   Codeine Other (See Comments)    Pt doesn't remember      Social History   Socioeconomic  History   Marital status: Widowed    Spouse name: Not on file   Number of children: Not on file   Years of education: Not on file   Highest education level: Not on file  Occupational History   Not on file  Tobacco Use   Smoking status: Never   Smokeless tobacco: Never  Vaping Use   Vaping Use: Never used  Substance and Sexual Activity   Alcohol use: No   Drug use: No   Sexual activity: Not on file  Other Topics Concern   Not on file  Social History Narrative   Not on file   Social Determinants of Health   Financial Resource Strain: Not on file  Food Insecurity: Not on file  Transportation Needs: Not on file  Physical Activity: Not on file  Stress: Not on file  Social Connections: Not on file  Intimate Partner Violence: Not on file     ROS- All systems are reviewed and negative except as per the HPI above.  Physical Exam: Vitals:   04/22/21 1433  BP: 132/62  Pulse: (!) 54  Weight: 63.3 kg  Height: 5\' 8"  (1.727 m)    GEN- The patient is a well appearing elderly female, alert and oriented x 3 today.   HEENT-head normocephalic, atraumatic, sclera clear, conjunctiva pink, hearing intact, trachea midline. Lungs- Clear to ausculation bilaterally, normal work of breathing Heart- Regular rate and rhythm, bradycardia, no murmurs, rubs or gallops  GI- soft, NT, ND, + BS Extremities- no clubbing, cyanosis, or edema MS- no significant deformity or atrophy Skin- no rash or lesion Psych- euthymic mood, full affect Neuro- strength and sensation are intact   Wt Readings from Last 3 Encounters:  04/22/21 63.3 kg  10/28/20 64 kg  04/22/20 66 kg    EKG today demonstrates  SB, 1st degree AV block Vent. rate 54 BPM PR interval 252 ms QRS duration 88 ms QT/QTcB 428/405 ms  Echo 08/25/15 demonstrated  - Left ventricle: The cavity size was normal. Wall thickness was    normal. Systolic function was normal. The estimated ejection    fraction was in the range of 55% to  60%. Wall motion was normal;    there were no regional wall motion abnormalities. Doppler    parameters are consistent with abnormal left ventricular    relaxation (grade 1 diastolic dysfunction). Doppler parameters    are consistent with high ventricular filling pressure.  - Mitral valve: Mild prolapse, involving the anterior leaflet and    the posterior leaflet. There was mild regurgitation.  - Left atrium: The atrium was mildly dilated.  - Pulmonary arteries: Systolic pressure was mildly increased. PA    peak pressure: 33 mm Hg (S).   Impressions:   - Normal LV systolic function; grade 1 diastolic dysfunction with    elevated LV filling pressure; mild LAE; bileaflet MVP with mild    MR; mild  TR with mildly elevated pulmonary pressure.   Epic records are reviewed at length today  CHA2DS2-VASc Score = 4  The patient's score is based upon: CHF History: 0 HTN History: 1 Diabetes History: 0 Stroke History: 0 Vascular Disease History: 0 Age Score: 2 Gender Score: 1        ASSESSMENT AND PLAN: 1. Paroxysmal Atrial Fibrillation (ICD10:  I48.0) The patient's CHA2DS2-VASc score is 4, indicating a 4.8% annual risk of stroke.   Patient appears to be maintaining SR. Continue Eliquis 5 mg BID (weight >60 kg and Cr < 1.5).  Will not start daily AV nodal agents given resting bradycardia and h/o nocturnal 2:1 heart block. Patient may use metoprolol 12.5 mg PRN q 6 hours for heart racing.  2. Secondary Hypercoagulable State (ICD10:  D68.69) The patient is at significant risk for stroke/thromboembolism based upon her CHA2DS2-VASc Score of 4.  Continue Apixaban (Eliquis).   3. HTN Stable, no changes today.   Follow up with Dr Acie Fredrickson per recall. AF clinic in one year.    [facility] [address] [place], [place] [address] [phone] 04/22/2021 3:43 PM

## 2021-07-22 ENCOUNTER — Other Ambulatory Visit (HOSPITAL_COMMUNITY): Payer: Self-pay | Admitting: Physician Assistant

## 2021-11-08 ENCOUNTER — Ambulatory Visit: Payer: Medicare HMO | Admitting: Cardiovascular Disease

## 2021-11-14 ENCOUNTER — Encounter: Payer: Self-pay | Admitting: Cardiovascular Disease

## 2021-11-14 NOTE — Progress Notes (Signed)
This encounter was created in error - please disregard.

## 2021-11-15 ENCOUNTER — Encounter: Payer: Medicare HMO | Admitting: Cardiovascular Disease

## 2021-11-30 ENCOUNTER — Encounter: Payer: Self-pay | Admitting: Cardiovascular Disease

## 2021-11-30 NOTE — Progress Notes (Unsigned)
Date:  12/01/2021   ID:  Michelle Gardner, DOB [birth year], MRN [id number]  Patient Location: Home Provider Location: Office  PCP:  Michelle Lopes, MD  Cardiologist:  Michelle Moores, MD  Electrophysiologist:  None   Evaluation Performed:  Follow-Up Visit  Chief Complaint:  HTN Problem LIst: 1. Hypertension 2. Mitral valve prolapse 3. S/p radioactive iodine  4. Sinus Bradycardia - chronic 5. Dizziness   6.  Paroxysmal atrial fib    Michelle Gardner is doing well. She has a history of hypertension and mild mitral prolapse. She's been able to do all of her normal activities without any significant problems.   Aug 11, 2015:    Michelle Gardner had an episode of lightheadedness. Happened in church,  Got very hot,  Was up in the choir, was wearing a choir robe Had eaten breakfast  Her pastor's wife Michelle Gardner's sister ) took her home  BP is still a bit a elevated. Its' time for her meds  Was seen by Dr. Sharlett Gardner - he increased the amlodipine to 5 mg a day .   BP readings at home are usually pretty well controlled.    Aug. 18, 2017   Michelle Gardner is seen today  No CP , no dizziness. Feeling well now   She had an episode of dizziness while at church.    Has had bradycardia and has pauses on the monitor during sleep .  Has never had syncope    Saw Dr. Rayann Gardner  - does not think she needs a pacer at this point .    Feb. 14, 2018: Doing well BP at home is normal  Is 88 yo now.   Is doing great for 88 yo    Sept. 26, 2018: Is 89  Doing great.   BP and HR look great .  No CP or dyspnea.  Michelle Gardner of Michelle Gardner)    July 25, 2017:   Is 89 now,  No complaints  BP has been normal at home .   No CP or dyspnea.  No dizziness   Oct. 24, 2019:   Doing well - is 88 yo Has had a slow HR for years No syncope or presyncope  (Sister has a pacer ) , mother lived to 30    Is very healthy,  Does not need a walker or cane to walk  Still lives in her own home. Does  her own shopping   Sep 04, 2019      Michelle Gardner is a 88 y.o. female with History of hypertension.  We are seeing her today for telemedicine visit.  VS looks good  Still very active Does not need walker or cane Goes to church  Has had both covid vaccines.  No swelling , no cough,   October 28, 2020  Michelle Gardner has had PAF since I last saw her ,  seen with Michelle Gardner ( former short stay nurse )  Took metoprolol XL , converted to sinus  Now takes toprol as needed  Remains in eliquis   Aug. 23, 2023 Michelle Gardner is seen for follow up of her PAF HR is slow , only takes the metoprolol  as needed  Overall doing very well for 88 yo     Past Medical History:  Diagnosis Date   HTN (hypertension)    Hyperlipidemia    Mitral regurgitation    MVP (mitral valve prolapse)  Near syncope    Peptic ulcer    Past Surgical History:  Procedure Laterality Date   CATARACT EXTRACTION Bilateral    ELBOW SURGERY     Wired   FOOT SURGERY     Cyst   NECK SURGERY     US ECHOCARDIOGRAPHY  01-02-2002   EF 65-70%     Current Meds  Medication Sig   acetaminophen (TYLENOL) 500 MG tablet Take 250 mg by mouth at bedtime as needed (pain).   amLODipine (NORVASC) 5 MG tablet Take 1 tablet (5 mg total) by mouth daily.   apixaban (ELIQUIS) 5 MG TABS tablet Take 1 tablet (5 mg total) by mouth 2 (two) times daily.   Calcium Carbonate Antacid (TUMS PO) Take 1 tablet by mouth daily.    CALCIUM PO Take 600 mg by mouth daily.    Cholecalciferol (VITAMIN D3) 10 MCG (400 UNIT) CAPS Take 1 capsule by mouth daily.   metoprolol succinate (TOPROL-XL) 25 MG 24 hr tablet Take 0.5 tablets (12.5 mg total) by mouth daily as needed (heart racing).   potassium chloride (K-DUR,KLOR-CON) 10 MEQ tablet Take 10 mEq by mouth daily.   triamterene-hydrochlorothiazide (MAXZIDE-25) 37.5-25 MG tablet Take 0.5 tablets by mouth daily.   VITAMIN D PO Take by mouth. Taking one tablet by mouth daily     Allergies:   Codeine    Social History   Tobacco Use   Smoking status: Never   Smokeless tobacco: Never  Vaping Use   Vaping Use: Never used  Substance Use Topics   Alcohol use: No   Drug use: No     Family Hx: The patient's family history includes Heart attack in her brother; Heart failure in her father; Other in her brother.  ROS:   Please see the history of present illness.     All other systems reviewed and are negative.   Prior CV studies:   The following studies were reviewed today:    Labs/Other Tests and Data Reviewed:    EKG:  No ECG reviewed.  Recent Labs: 04/22/2021: BUN 19; Creatinine, Ser 1.17; Hemoglobin 13.2; Platelets 292; Potassium 3.7; Sodium 140   Recent Lipid Panel No results found for: "CHOL", "TRIG", "HDL", "CHOLHDL", "LDLCALC", "LDLDIRECT"  Wt Readings from Last 3 Encounters:  12/01/21 134 lb 12.8 oz (61.1 kg)  04/22/21 139 lb 9.6 oz (63.3 kg)  10/28/20 141 lb 3.2 oz (64 kg)     Objective:    Vital Signs:  BP 130/72   Pulse (!) 52   Ht 5' 8.5" (1.74 m)   Wt 134 lb 12.8 oz (61.1 kg)   SpO2 97%   BMI 20.20 kg/m     Physical Exam: Blood pressure 130/72, pulse (!) 52, height 5' 8.5" (1.74 m), weight 134 lb 12.8 oz (61.1 kg), SpO2 97 %.       GEN:  Well nourished, well developed in no acute distress HEENT: Normal NECK: No JVD; No carotid bruits LYMPHATICS: No lymphadenopathy CARDIAC: RRR , no murmurs, rubs, gallops RESPIRATORY:  Clear to auscultation without rales, wheezing or rhonchi  ABDOMEN: Soft, non-tender, non-distended MUSCULOSKELETAL:  No edema; No deformity  SKIN: Warm and dry NEUROLOGIC:  Alert and oriented x 3    ASSESSMENT & PLAN:    HTN: Blood pressure is well controlled.   Continue current medications.   2.  PAF:   She is not having any significant episodes of paroxysmal atrial fibrillation.  Continue Eliquis for now.  She appears to be very stable  and does not have a history of falling.  Medication Adjustments/Labs and Tests  Ordered: Current medicines are reviewed at length with the patient today.  Concerns regarding medicines are outlined above.   Tests Ordered: No orders of the defined types were placed in this encounter.    Medication Changes: No orders of the defined types were placed in this encounter.    Follow Up:    Signed, Michelle Moores, MD  12/01/2021 11:56 AM    [place]

## 2021-12-01 ENCOUNTER — Encounter: Payer: Self-pay | Admitting: Cardiovascular Disease

## 2021-12-01 ENCOUNTER — Ambulatory Visit: Payer: Medicare HMO | Admitting: Cardiovascular Disease

## 2021-12-01 VITALS — BP 130/72 | HR 52 | Ht 68.5 in | Wt 134.8 lb

## 2021-12-01 DIAGNOSIS — I480 Paroxysmal atrial fibrillation: Secondary | ICD-10-CM

## 2021-12-01 DIAGNOSIS — I10 Essential (primary) hypertension: Secondary | ICD-10-CM | POA: Diagnosis not present

## 2021-12-01 NOTE — Patient Instructions (Signed)
Medication Instructions:  Your physician recommends that you continue on your current medications as directed. Please refer to the Current Medication list given to you today.  *If you need a refill on your cardiac medications before your next appointment, please call your pharmacy*   Follow-Up: At [facility], you and your health needs are our priority.  As part of our continuing mission to provide you with exceptional heart care, we have created designated Provider Care Teams.  These Care Teams include your primary Cardiologist (physician) and Advanced Practice Providers (APPs -  Physician Assistants and Nurse Practitioners) who all work together to provide you with the care you need, when you need it.  Your next appointment:   1 year(s)  The format for your next appointment:   In Person  Provider:   Philip Nahser, MD            

## 2022-01-22 DIAGNOSIS — Z23 Encounter for immunization: Secondary | ICD-10-CM | POA: Diagnosis not present

## 2022-01-31 ENCOUNTER — Ambulatory Visit: Payer: Medicare HMO | Admitting: Podiatry

## 2022-01-31 DIAGNOSIS — M79675 Pain in left toe(s): Secondary | ICD-10-CM | POA: Diagnosis not present

## 2022-01-31 DIAGNOSIS — B351 Tinea unguium: Secondary | ICD-10-CM

## 2022-01-31 DIAGNOSIS — Z7901 Long term (current) use of anticoagulants: Secondary | ICD-10-CM | POA: Diagnosis not present

## 2022-01-31 DIAGNOSIS — M79674 Pain in right toe(s): Secondary | ICD-10-CM

## 2022-01-31 NOTE — Progress Notes (Signed)
  Subjective:  Patient ID: Michelle Gardner, female    DOB: [birth year],  MRN: [id number]  Chief Complaint  Patient presents with   Nail Problem     New Patient -nails thickened elongated causing pain and cannot cut them    88 y.o. female presents with the above complaint. History confirmed with patient.  She is on Eliquis for PAF  Objective:  Physical Exam: warm, good capillary refill, no trophic changes or ulcerative lesions, normal DP and PT pulses, and normal sensory exam. Left Foot: dystrophic yellowed discolored nail plates with subungual debris Right Foot: dystrophic yellowed discolored nail plates with subungual debris  Assessment:   1. Pain due to onychomycosis of toenails of both feet   2. Chronic anticoagulation      Plan:  Patient was evaluated and treated and all questions answered.  Discussed the etiology and treatment options for the condition in detail with the patient. Educated patient on the topical and oral treatment options for mycotic nails. Recommended debridement of the nails today. Sharp and mechanical debridement performed of all painful and mycotic nails today. Nails debrided in length and thickness using a nail nipper to level of comfort. Discussed treatment options including appropriate shoe gear. Follow up as needed for painful nails.    Return in about 3 months (around 05/03/2022) for RFC.

## 2022-02-09 ENCOUNTER — Other Ambulatory Visit (HOSPITAL_BASED_OUTPATIENT_CLINIC_OR_DEPARTMENT_OTHER): Payer: Self-pay

## 2022-02-09 MED ORDER — COMIRNATY 30 MCG/0.3ML IM SUSY
PREFILLED_SYRINGE | INTRAMUSCULAR | 0 refills | Status: DC
  Filled 2022-02-09: qty 0.3, 1d supply, fill #0

## 2022-02-22 DIAGNOSIS — Z961 Presence of intraocular lens: Secondary | ICD-10-CM | POA: Diagnosis not present

## 2022-02-22 DIAGNOSIS — H40013 Open angle with borderline findings, low risk, bilateral: Secondary | ICD-10-CM | POA: Diagnosis not present

## 2022-03-14 DIAGNOSIS — C44519 Basal cell carcinoma of skin of other part of trunk: Secondary | ICD-10-CM | POA: Diagnosis not present

## 2022-03-14 DIAGNOSIS — C44612 Basal cell carcinoma of skin of right upper limb, including shoulder: Secondary | ICD-10-CM | POA: Diagnosis not present

## 2022-03-14 DIAGNOSIS — D485 Neoplasm of uncertain behavior of skin: Secondary | ICD-10-CM | POA: Diagnosis not present

## 2022-04-18 DIAGNOSIS — I10 Essential (primary) hypertension: Secondary | ICD-10-CM | POA: Diagnosis not present

## 2022-04-18 DIAGNOSIS — E785 Hyperlipidemia, unspecified: Secondary | ICD-10-CM | POA: Diagnosis not present

## 2022-04-18 DIAGNOSIS — M810 Age-related osteoporosis without current pathological fracture: Secondary | ICD-10-CM | POA: Diagnosis not present

## 2022-04-18 DIAGNOSIS — R7989 Other specified abnormal findings of blood chemistry: Secondary | ICD-10-CM | POA: Diagnosis not present

## 2022-04-18 DIAGNOSIS — E041 Nontoxic single thyroid nodule: Secondary | ICD-10-CM | POA: Diagnosis not present

## 2022-04-25 DIAGNOSIS — Z7901 Long term (current) use of anticoagulants: Secondary | ICD-10-CM | POA: Diagnosis not present

## 2022-04-25 DIAGNOSIS — Z23 Encounter for immunization: Secondary | ICD-10-CM | POA: Diagnosis not present

## 2022-04-25 DIAGNOSIS — R82998 Other abnormal findings in urine: Secondary | ICD-10-CM | POA: Diagnosis not present

## 2022-04-25 DIAGNOSIS — M810 Age-related osteoporosis without current pathological fracture: Secondary | ICD-10-CM | POA: Diagnosis not present

## 2022-04-25 DIAGNOSIS — I10 Essential (primary) hypertension: Secondary | ICD-10-CM | POA: Diagnosis not present

## 2022-04-25 DIAGNOSIS — R69 Illness, unspecified: Secondary | ICD-10-CM | POA: Diagnosis not present

## 2022-04-25 DIAGNOSIS — E041 Nontoxic single thyroid nodule: Secondary | ICD-10-CM | POA: Diagnosis not present

## 2022-04-25 DIAGNOSIS — E8809 Other disorders of plasma-protein metabolism, not elsewhere classified: Secondary | ICD-10-CM | POA: Diagnosis not present

## 2022-04-25 DIAGNOSIS — Z0000 Encounter for general adult medical examination without abnormal findings: Secondary | ICD-10-CM | POA: Diagnosis not present

## 2022-04-25 DIAGNOSIS — Z7689 Persons encountering health services in other specified circumstances: Secondary | ICD-10-CM | POA: Diagnosis not present

## 2022-04-25 DIAGNOSIS — E785 Hyperlipidemia, unspecified: Secondary | ICD-10-CM | POA: Diagnosis not present

## 2022-04-25 DIAGNOSIS — I480 Paroxysmal atrial fibrillation: Secondary | ICD-10-CM | POA: Diagnosis not present

## 2022-04-25 DIAGNOSIS — G301 Alzheimer's disease with late onset: Secondary | ICD-10-CM | POA: Diagnosis not present

## 2022-05-03 ENCOUNTER — Ambulatory Visit: Payer: Medicare HMO | Admitting: Podiatry

## 2022-05-16 ENCOUNTER — Ambulatory Visit (HOSPITAL_COMMUNITY)
Admission: RE | Admit: 2022-05-16 | Discharge: 2022-05-16 | Disposition: A | Discharge status: Home / Self Care | Payer: Medicare HMO | Source: Ambulatory Visit | Attending: Physician Assistant | Admitting: Physician Assistant

## 2022-05-16 ENCOUNTER — Ambulatory Visit: Payer: Medicare HMO | Admitting: Podiatry

## 2022-05-16 ENCOUNTER — Encounter: Payer: Self-pay | Admitting: Podiatry

## 2022-05-16 VITALS — BP 164/68 | HR 50 | Ht 68.5 in | Wt 134.2 lb

## 2022-05-16 DIAGNOSIS — I341 Nonrheumatic mitral (valve) prolapse: Secondary | ICD-10-CM | POA: Diagnosis not present

## 2022-05-16 DIAGNOSIS — M79675 Pain in left toe(s): Secondary | ICD-10-CM

## 2022-05-16 DIAGNOSIS — M79674 Pain in right toe(s): Secondary | ICD-10-CM

## 2022-05-16 DIAGNOSIS — I480 Paroxysmal atrial fibrillation: Secondary | ICD-10-CM | POA: Diagnosis not present

## 2022-05-16 DIAGNOSIS — Z7901 Long term (current) use of anticoagulants: Secondary | ICD-10-CM

## 2022-05-16 DIAGNOSIS — D6869 Other thrombophilia: Secondary | ICD-10-CM | POA: Insufficient documentation

## 2022-05-16 DIAGNOSIS — R001 Bradycardia, unspecified: Secondary | ICD-10-CM | POA: Insufficient documentation

## 2022-05-16 DIAGNOSIS — B351 Tinea unguium: Secondary | ICD-10-CM | POA: Diagnosis not present

## 2022-05-16 DIAGNOSIS — I10 Essential (primary) hypertension: Secondary | ICD-10-CM | POA: Insufficient documentation

## 2022-05-16 DIAGNOSIS — Z79899 Other long term (current) drug therapy: Secondary | ICD-10-CM | POA: Insufficient documentation

## 2022-05-16 DIAGNOSIS — I440 Atrioventricular block, first degree: Secondary | ICD-10-CM | POA: Diagnosis not present

## 2022-05-16 NOTE — Progress Notes (Signed)
Primary Care Physician: Donnajean Lopes, MD Primary Cardiologist: Dr Acie Fredrickson Primary Electrophysiologist: Dr Rayann Heman (remotely) Referring Physician: [facility] ED   Michelle Gardner is a 89 y.o. female with a history of HTN, MVP, and atrial fibrillation who presents for follow up in the [facility]. The patient was initially diagnosed with atrial fibrillation 12/22/19 after presenting to the ED with symptoms of increased heart rate and BP noted on a home BP machine. ECG showed afib HR 105. She did not have any symptoms associated with her arrhythmia. Patient was started on Eliquis for a CHADS2VASC score of 4 and metoprolol for rate control. She did not start the metoprolol because of her resting bradycardia. Patient denies significant alcohol use or snoring.  On follow up today, patient reports that she has done well since her last visit with no known episodes of afib. No bleeding issues on anticoagulation.   Today, she denies symptoms of palpitations, chest pain, shortness of breath, orthopnea, PND, lower extremity edema, dizziness, presyncope, syncope, snoring, daytime somnolence, bleeding, or neurologic sequela. The patient is tolerating medications without difficulties and is otherwise without complaint today.    Atrial Fibrillation Risk Factors:  she does not have symptoms or diagnosis of sleep apnea. she does not have a history of rheumatic fever. she does not have a history of alcohol use. The patient does have a history of early familial atrial fibrillation or other arrhythmias. Mother and sister had afib.   she has a BMI of Body mass index is 20.11 kg/m.[name] Filed Weights   05/16/22 1130  Weight: 60.9 kg     Family History  Problem Relation Age of Onset   Heart failure Father    Other Brother    Heart attack Brother      Atrial Fibrillation Management history:  Previous antiarrhythmic drugs: none Previous cardioversions:  none Previous ablations: none CHADS2VASC score: 4 Anticoagulation history: Eliquis   Past Medical History:  Diagnosis Date   HTN (hypertension)    Hyperlipidemia    Mitral regurgitation    MVP (mitral valve prolapse)    Near syncope    Peptic ulcer    Past Surgical History:  Procedure Laterality Date   CATARACT EXTRACTION Bilateral    ELBOW SURGERY     Wired   FOOT SURGERY     Cyst   NECK SURGERY     US ECHOCARDIOGRAPHY  01-02-2002   EF 65-70%    Current Outpatient Medications  Medication Sig Dispense Refill   acetaminophen (TYLENOL) 500 MG tablet Take 250 mg by mouth 2 (two) times daily.     amLODipine (NORVASC) 5 MG tablet Take 1 tablet (5 mg total) by mouth daily. 90 tablet 3   apixaban (ELIQUIS) 5 MG TABS tablet Take 1 tablet (5 mg total) by mouth 2 (two) times daily. 180 tablet 3   CALCIUM PO Take 600 mg by mouth daily.      metoprolol succinate (TOPROL-XL) 25 MG 24 hr tablet Take 0.5 tablets (12.5 mg total) by mouth daily as needed (heart racing). 15 tablet 0   potassium chloride (K-DUR,KLOR-CON) 10 MEQ tablet Take 10 mEq by mouth daily.     triamterene-hydrochlorothiazide (MAXZIDE-25) 37.5-25 MG tablet Take 0.5 tablets by mouth daily. 45 tablet 3   No current facility-administered medications for this encounter.    Allergies  Allergen Reactions   Codeine Other (See Comments)    Pt doesn't remember      Social History  Socioeconomic History   Marital status: Widowed    Spouse name: Not on file   Number of children: Not on file   Years of education: Not on file   Highest education level: Not on file  Occupational History   Not on file  Tobacco Use   Smoking status: Never   Smokeless tobacco: Never  Vaping Use   Vaping Use: Never used  Substance and Sexual Activity   Alcohol use: No   Drug use: No   Sexual activity: Not on file  Other Topics Concern   Not on file  Social History Narrative   Not on file   Social Determinants of Health    Financial Resource Strain: Not on file  Food Insecurity: Not on file  Transportation Needs: Not on file  Physical Activity: Not on file  Stress: Not on file  Social Connections: Not on file  Intimate Partner Violence: Not on file     ROS- All systems are reviewed and negative except as per the HPI above.  Physical Exam: Vitals:   05/16/22 1130  BP: (!) 164/68  Pulse: (!) 50  Weight: 60.9 kg  Height: 5' 8.5" (1.74 m)    GEN- The patient is a well appearing elderly female, alert and oriented x 3 today.   HEENT-head normocephalic, atraumatic, sclera clear, conjunctiva pink, hearing intact, trachea midline. Lungs- Clear to ausculation bilaterally, normal work of breathing Heart- Regular rate and rhythm, no murmurs, rubs or gallops  GI- soft, NT, ND, + BS Extremities- no clubbing, cyanosis, or edema MS- no significant deformity or atrophy Skin- no rash or lesion Psych- euthymic mood, full affect Neuro- strength and sensation are intact   Wt Readings from Last 3 Encounters:  05/16/22 60.9 kg  12/01/21 61.1 kg  04/22/21 63.3 kg    EKG today demonstrates  SB, 1st degree AV block Vent. rate 50 BPM PR interval 264 ms QRS duration 84 ms QT/QTcB 452/412 ms  Echo 08/25/15 demonstrated  - Left ventricle: The cavity size was normal. Wall thickness was    normal. Systolic function was normal. The estimated ejection    fraction was in the range of 55% to 60%. Wall motion was normal;    there were no regional wall motion abnormalities. Doppler    parameters are consistent with abnormal left ventricular    relaxation (grade 1 diastolic dysfunction). Doppler parameters    are consistent with high ventricular filling pressure.  - Mitral valve: Mild prolapse, involving the anterior leaflet and    the posterior leaflet. There was mild regurgitation.  - Left atrium: The atrium was mildly dilated.  - Pulmonary arteries: Systolic pressure was mildly increased. PA    peak pressure:  33 mm Hg (S).   Impressions:   - Normal LV systolic function; grade 1 diastolic dysfunction with    elevated LV filling pressure; mild LAE; bileaflet MVP with mild    MR; mild TR with mildly elevated pulmonary pressure.   Epic records are reviewed at length today  CHA2DS2-VASc Score = 4  The patient's score is based upon: CHF History: 0 HTN History: 1 Diabetes History: 0 Stroke History: 0 Vascular Disease History: 0 Age Score: 2 Gender Score: 1       ASSESSMENT AND PLAN: 1. Paroxysmal Atrial Fibrillation (ICD10:  I48.0) The patient's CHA2DS2-VASc score is 4, indicating a 4.8% annual risk of stroke.   Patient appears to be maintaining SR. Continue Eliquis 5 mg BID (weight >60 kg and Cr < 1.5).  Patient's family to get recent labs from PCP for review. Will need to watch closely for dose reduction.  Will not start daily AV nodal agents given resting bradycardia and h/o nocturnal 2:1 heart block. Patient may use metoprolol 12.5 mg PRN q 6 hours for heart racing.  2. Secondary Hypercoagulable State (ICD10:  D68.69) The patient is at significant risk for stroke/thromboembolism based upon her CHA2DS2-VASc Score of 4.  Continue Apixaban (Eliquis).   3. HTN Elevated today, typically well controlled at home. No changes today.    Follow up with Dr Acie Fredrickson per recall. AF clinic in one year.    [facility] [address] [place], [place] [address] [phone] 05/16/2022 1:22 PM

## 2022-05-16 NOTE — Progress Notes (Signed)
This patient returns to my office for at risk foot care.  This patient requires this care by a professional since this patient will be at risk due to having coagulation defect.  This patient is unable to cut nails himself since the patient cannot reach his nails.These nails are painful walking and wearing shoes. She presents to the office with her daughter. This patient presents for at risk foot care today.  General Appearance  Alert, conversant and in no acute stress.  Vascular  Dorsalis pedis and posterior tibial  pulses are  weakly palpable  bilaterally.  Capillary return is within normal limits  bilaterally. Temperature is within normal limits  bilaterally.  Neurologic  Senn-Weinstein monofilament wire test within normal limits  bilaterally. Muscle power within normal limits bilaterally.  Nails Thick disfigured discolored nails with subungual debris  from hallux to fifth toes bilaterally. No evidence of bacterial infection or drainage bilaterally.  Orthopedic  No limitations of motion  feet .  No crepitus or effusions noted.  No bony pathology or digital deformities noted.  Skin  normotropic skin with no porokeratosis noted bilaterally.  No signs of infections or ulcers noted.     Onychomycosis  Pain in right toes  Pain in left toes  Consent was obtained for treatment procedures.   Mechanical debridement of nails 1-5  bilaterally performed with a nail nipper.  Filed with dremel without incident.    Return office visit  3 months                    Told patient to return for periodic foot care and evaluation due to potential at risk complications.   Gardiner Barefoot DPM

## 2022-06-01 ENCOUNTER — Telehealth: Payer: Self-pay | Admitting: Cardiovascular Disease

## 2022-06-01 MED ORDER — AMLODIPINE BESYLATE 5 MG PO TABS: 5.0000 mg | ORAL_TABLET | Freq: Every day | ORAL | 3 refills | Status: DC

## 2022-06-01 MED ORDER — TRIAMTERENE-HCTZ 37.5-25 MG PO TABS: 0.5000 | ORAL_TABLET | Freq: Every day | ORAL | 3 refills | Status: DC

## 2022-06-01 NOTE — Telephone Encounter (Signed)
Left VM to notify family that prescriptions refills have been submitted.

## 2022-06-01 NOTE — Telephone Encounter (Signed)
Pt c/o medication issue:  1. Name of Medication: amLODipine (NORVASC) 5 MG tablet ; triamterene-hydrochlorothiazide (MAXZIDE-25) 37.5-25 MG tablet   2. How are you currently taking this medication (dosage and times per day)?  Take 1 tablet (5 mg total) by mouth daily      3. Are you having a reaction (difficulty breathing--STAT)? No   4. What is your medication issue? Patient is out of medication; please send new 3 refill, 90 day prescription to [facility] [id number] - [place], [place]

## 2022-07-13 ENCOUNTER — Other Ambulatory Visit (HOSPITAL_COMMUNITY): Payer: Self-pay | Admitting: Physician Assistant

## 2022-08-12 ENCOUNTER — Ambulatory Visit: Payer: Medicare HMO | Admitting: Podiatry

## 2022-08-12 ENCOUNTER — Encounter: Payer: Self-pay | Admitting: Podiatry

## 2022-08-12 DIAGNOSIS — Z7901 Long term (current) use of anticoagulants: Secondary | ICD-10-CM

## 2022-08-12 DIAGNOSIS — M79675 Pain in left toe(s): Secondary | ICD-10-CM | POA: Diagnosis not present

## 2022-08-12 DIAGNOSIS — M79674 Pain in right toe(s): Secondary | ICD-10-CM

## 2022-08-12 DIAGNOSIS — B351 Tinea unguium: Secondary | ICD-10-CM | POA: Diagnosis not present

## 2022-08-12 NOTE — Progress Notes (Signed)
This patient returns to my office for at risk foot care.  This patient requires this care by a professional since this patient will be at risk due to having coagulation defect.  This patient is unable to cut nails himself since the patient cannot reach his nails.These nails are painful walking and wearing shoes. She presents to the office with her daughter. This patient presents for at risk foot care today.  General Appearance  Alert, conversant and in no acute stress.  Vascular  Dorsalis pedis and posterior tibial  pulses are  weakly palpable  bilaterally.  Capillary return is within normal limits  bilaterally. Temperature is within normal limits  bilaterally.  Neurologic  Senn-Weinstein monofilament wire test within normal limits  bilaterally. Muscle power within normal limits bilaterally.  Nails Thick disfigured discolored nails with subungual debris  from hallux to fifth toes bilaterally. No evidence of bacterial infection or drainage bilaterally.  Orthopedic  No limitations of motion  feet .  No crepitus or effusions noted.  No bony pathology or digital deformities noted.  Skin  normotropic skin with no porokeratosis noted bilaterally.  No signs of infections or ulcers noted.     Onychomycosis  Pain in right toes  Pain in left toes  Consent was obtained for treatment procedures.   Mechanical debridement of nails 1-5  bilaterally performed with a nail nipper.  Filed with dremel without incident.    Return office visit  3 months                    Told patient to return for periodic foot care and evaluation due to potential at risk complications.   Burnard Enis DPM   

## 2022-08-16 ENCOUNTER — Ambulatory Visit: Payer: Medicare HMO | Admitting: Podiatry

## 2022-11-03 DIAGNOSIS — R634 Abnormal weight loss: Secondary | ICD-10-CM | POA: Diagnosis not present

## 2022-11-03 DIAGNOSIS — G301 Alzheimer's disease with late onset: Secondary | ICD-10-CM | POA: Diagnosis not present

## 2022-11-03 DIAGNOSIS — L989 Disorder of the skin and subcutaneous tissue, unspecified: Secondary | ICD-10-CM | POA: Diagnosis not present

## 2022-11-03 DIAGNOSIS — I480 Paroxysmal atrial fibrillation: Secondary | ICD-10-CM | POA: Diagnosis not present

## 2022-11-03 DIAGNOSIS — I10 Essential (primary) hypertension: Secondary | ICD-10-CM | POA: Diagnosis not present

## 2022-11-14 ENCOUNTER — Encounter: Payer: Self-pay | Admitting: Podiatry

## 2022-11-14 ENCOUNTER — Ambulatory Visit: Payer: Medicare HMO | Admitting: Podiatry

## 2022-11-14 DIAGNOSIS — Z7901 Long term (current) use of anticoagulants: Secondary | ICD-10-CM | POA: Diagnosis not present

## 2022-11-14 DIAGNOSIS — M79675 Pain in left toe(s): Secondary | ICD-10-CM

## 2022-11-14 DIAGNOSIS — B351 Tinea unguium: Secondary | ICD-10-CM

## 2022-11-14 DIAGNOSIS — M79674 Pain in right toe(s): Secondary | ICD-10-CM

## 2022-11-14 NOTE — Progress Notes (Signed)
This patient returns to my office for at risk foot care.  This patient requires this care by a professional since this patient will be at risk due to having coagulation defect.  This patient is unable to cut nails himself since the patient cannot reach his nails.These nails are painful walking and wearing shoes. She presents to the office with her daughter. This patient presents for at risk foot care today.  General Appearance  Alert, conversant and in no acute stress.  Vascular  Dorsalis pedis and posterior tibial  pulses are  weakly palpable  bilaterally.  Capillary return is within normal limits  bilaterally. Temperature is within normal limits  bilaterally.  Neurologic  Senn-Weinstein monofilament wire test within normal limits  bilaterally. Muscle power within normal limits bilaterally.  Nails Thick disfigured discolored nails with subungual debris  from hallux to fifth toes bilaterally. No evidence of bacterial infection or drainage bilaterally.  Orthopedic  No limitations of motion  feet .  No crepitus or effusions noted.  No bony pathology or digital deformities noted.  Skin  normotropic skin with no porokeratosis noted bilaterally.  No signs of infections or ulcers noted.     Onychomycosis  Pain in right toes  Pain in left toes  Consent was obtained for treatment procedures.   Mechanical debridement of nails 1-5  bilaterally performed with a nail nipper.  Filed with dremel without incident.    Return office visit  3 months                    Told patient to return for periodic foot care and evaluation due to potential at risk complications.     DPM   

## 2022-11-15 ENCOUNTER — Other Ambulatory Visit (HOSPITAL_BASED_OUTPATIENT_CLINIC_OR_DEPARTMENT_OTHER): Payer: Self-pay

## 2022-11-15 MED ORDER — AREXVY 120 MCG/0.5ML IM SUSR
0.5000 mL | Freq: Once | INTRAMUSCULAR | 0 refills | Status: AC
  Filled 2022-11-15: qty 0.5, 1d supply, fill #0

## 2022-11-24 DIAGNOSIS — C44719 Basal cell carcinoma of skin of left lower limb, including hip: Secondary | ICD-10-CM | POA: Diagnosis not present

## 2022-11-24 DIAGNOSIS — D485 Neoplasm of uncertain behavior of skin: Secondary | ICD-10-CM | POA: Diagnosis not present

## 2022-12-01 ENCOUNTER — Encounter: Payer: Self-pay | Admitting: Cardiovascular Disease

## 2022-12-01 NOTE — Progress Notes (Signed)
Date:  12/02/2022   ID:  Michelle Gardner, DOB [id number], MRN [id number]  Patient Location: Home Provider Location: Office  PCP:  Garlan Fillers, MD  Cardiologist:  Kristeen Miss, MD  Electrophysiologist:  None   Evaluation Performed:  Follow-Up Visit  Chief Complaint:  HTN Problem LIst: 1. Hypertension 2. Mitral valve prolapse 3. S/p radioactive iodine  4. Sinus Bradycardia - chronic 5. Dizziness   6.  Paroxysmal atrial fib    Michelle Gardner is doing well. She has a history of hypertension and mild mitral prolapse. She's been able to do all of her normal activities without any significant problems.   Aug 11, 2015:    Michelle Gardner had an episode of lightheadedness. Happened in church,  Got very hot,  Was up in the choir, was wearing a choir robe Had eaten breakfast  Her pastor's wife Maryann Conners Woodard's sister ) took her home  BP is still a bit a elevated. Its' time for her meds  Was seen by Dr. Jarold Motto - he increased the amlodipine to 5 mg a day .   BP readings at home are usually pretty well controlled.    Aug. 18, 2017   Michelle Gardner is seen today  No CP , no dizziness. Feeling well now   She had an episode of dizziness while at church.    Has had bradycardia and has pauses on the monitor during sleep .  Has never had syncope    Saw Dr. Johney Frame  - does not think she needs a pacer at this point .    Feb. 14, 2018: Doing well BP at home is normal  Is 89 yo now.   Is doing great for 89 yo    Sept. 26, 2018: Is 89  Doing great.   BP and HR look great .  No CP or dyspnea.  Clearence Michelle Gardner)    July 25, 2017:   Is 89 now,  No complaints  BP has been normal at home .   No CP or dyspnea.  No dizziness   Oct. 24, 2019:   Doing well - is 89 yo Has had a slow HR for years No syncope or presyncope  (Sister has a pacer ) , mother lived to 91    Is very healthy,  Does not need a walker or cane to walk  Still lives in her own home. Does  her own shopping   Sep 04, 2019      Michelle Gardner is a 89 y.o. female with History of hypertension.  We are seeing her today for telemedicine visit.  VS looks good  Still very active Does not need walker or cane Goes to church  Has had both covid vaccines.  No swelling , no cough,   October 28, 2020  Michelle Gardner has had PAF since I last saw her ,  seen with Rosalio Macadamia ( former short stay nurse )  Took metoprolol XL , converted to sinus  Now takes toprol as needed  Remains in eliquis   Aug. 23, 2023 Michelle Gardner is seen for follow up of her PAF HR is slow , only takes the metoprolol  as needed  Overall doing very well for 89 yo    Aug. 23, 2024 Michelle Gardner is seen for follow up of her PAF  Seen with niece  Jasmine December  Is eating lots of fast foods.  No CP  We discussed the fact that as she continues to lose weight and in fact when she goes below 132 pounds at home we should reduce her Eliquis dose to 2.5 mg twice a day.   Past Medical History:  Diagnosis Date   HTN (hypertension)    Hyperlipidemia    Mitral regurgitation    MVP (mitral valve prolapse)    Near syncope    Peptic ulcer    Past Surgical History:  Procedure Laterality Date   CATARACT EXTRACTION Bilateral    ELBOW SURGERY     Wired   FOOT SURGERY     Cyst   NECK SURGERY     US ECHOCARDIOGRAPHY  01-02-2002   EF 65-70%     Current Meds  Medication Sig   acetaminophen (TYLENOL) 500 MG tablet Take 250 mg by mouth 2 (two) times daily.   amLODipine (NORVASC) 5 MG tablet Take 1 tablet (5 mg total) by mouth daily.   CALCIUM PO Take 600 mg by mouth daily.    ELIQUIS 5 MG TABS tablet TAKE ONE TABLET BY MOUTH TWICE A DAY   metoprolol succinate (TOPROL-XL) 25 MG 24 hr tablet Take 0.5 tablets (12.5 mg total) by mouth daily as needed (heart racing).   potassium chloride (K-DUR,KLOR-CON) 10 MEQ tablet Take 10 mEq by mouth daily.   triamterene-hydrochlorothiazide (MAXZIDE-25) 37.5-25 MG tablet Take 0.5 tablets by mouth  daily.     Allergies:   Codeine   Social History   Tobacco Use   Smoking status: Never   Smokeless tobacco: Never  Vaping Use   Vaping status: Never Used  Substance Use Topics   Alcohol use: No   Drug use: No     Family Hx: The patient's family history includes Heart attack in her brother; Heart failure in her father; Other in her brother.  ROS:   Please see the history of present illness.     All other systems reviewed and are negative.   Prior CV studies:   The following studies were reviewed today:    Labs/Other Tests and Data Reviewed:    EKG:   EKG Interpretation Date/Time:  Friday December 02 2022 11:41:48 EDT Ventricular Rate:  48 PR Interval:  310 QRS Duration:  88 QT Interval:  452 QTC Calculation: 403 R Axis:   54  Text Interpretation: Sinus bradycardia with 1st degree A-V block with Premature atrial complexes Cannot rule out Anterior infarct (cited on or before 02-Dec-2022) When compared with ECG of 16-May-2022 11:51, Premature atrial complexes are now Present Confirmed by Kristeen Miss ([id number]) on 12/02/2022 3:00:01 PM    Recent Labs: No results found for requested labs within last 365 days.   Recent Lipid Panel No results found for: "CHOL", "TRIG", "HDL", "CHOLHDL", "LDLCALC", "LDLDIRECT"  Wt Readings from Last 3 Encounters:  12/02/22 138 lb 6.4 oz (62.8 kg)  05/16/22 134 lb 3.2 oz (60.9 kg)  12/01/21 134 lb 12.8 oz (61.1 kg)     Objective:       Physical Exam: Blood pressure (!) 140/70, pulse (!) 48, height 5' 8.5" (1.74 m), weight 138 lb 6.4 oz (62.8 kg), SpO2 98%.  HYPERTENSION CONTROL Vitals:   12/02/22 1134 12/02/22 1157  BP: (!) 154/70 (!) 140/70    The patient's blood pressure is elevated above target today.  In order to address the patient's elevated BP: Blood pressure will be monitored at home to determine if medication changes need to be made.       GEN:  Well  nourished, well developed in no acute distress HEENT:  Normal NECK: No JVD; No carotid bruits LYMPHATICS: No lymphadenopathy CARDIAC: RRR , no murmurs, rubs, gallops RESPIRATORY:  Clear to auscultation without rales, wheezing or rhonchi  ABDOMEN: Soft, non-tender, non-distended MUSCULOSKELETAL:  No edema; No deformity  SKIN: Warm and dry NEUROLOGIC:  Alert and oriented x 3     ASSESSMENT & PLAN:    HTN: Overall blood pressure looks to be fairly well-controlled.  It is a little bit elevated here in the office but she is due for her at amlodipine dose.   2.  PAF:   Continue Eliquis.  She is currently on 5 mg twice a day.  If she continues to lose weight then we will need to reduce her dose to 2.5 mg once she gets below 60 kg which is 132 pounds.  Her niece will continue to watch for this.  Her HR is slow,  she takes toprol XL as needed , not that often   Medication Adjustments/Labs and Tests Ordered: Current medicines are reviewed at length with the patient today.  Concerns regarding medicines are outlined above.   Tests Ordered: Orders Placed This Encounter  Procedures   EKG 12-Lead     Medication Changes: No orders of the defined types were placed in this encounter.    Follow Up:    Signed, Kristeen Miss, MD  12/02/2022 3:02 PM    [facility]

## 2022-12-02 ENCOUNTER — Encounter: Payer: Self-pay | Admitting: Cardiovascular Disease

## 2022-12-02 ENCOUNTER — Ambulatory Visit: Payer: Medicare HMO | Attending: Cardiovascular Disease | Admitting: Cardiovascular Disease

## 2022-12-02 VITALS — BP 140/70 | HR 48 | Ht 68.5 in | Wt 138.4 lb

## 2022-12-02 DIAGNOSIS — I480 Paroxysmal atrial fibrillation: Secondary | ICD-10-CM

## 2022-12-02 DIAGNOSIS — I10 Essential (primary) hypertension: Secondary | ICD-10-CM

## 2022-12-02 NOTE — Patient Instructions (Signed)
Medication Instructions:  Your physician recommends that you continue on your current medications as directed. Please refer to the Current Medication list given to you today.  *If you need a refill on your cardiac medications before your next appointment, please call your pharmacy*  Lab Work: None ordered today  Testing/Procedures: None ordered today  Follow-Up: At [facility], you and your health needs are our priority.  As part of our continuing mission to provide you with exceptional heart care, we have created designated Provider Care Teams.  These Care Teams include your primary Cardiologist (physician) and Advanced Practice Providers (APPs -  Physician Assistants and Nurse Practitioners) who all work together to provide you with the care you need, when you need it.  Your next appointment:   12 month(s)  The format for your next appointment:   In Person  Provider:   Philip Nahser, MD   

## 2022-12-26 ENCOUNTER — Other Ambulatory Visit (HOSPITAL_BASED_OUTPATIENT_CLINIC_OR_DEPARTMENT_OTHER): Payer: Self-pay

## 2022-12-26 MED ORDER — COVID-19 MRNA VAC-TRIS(PFIZER) 30 MCG/0.3ML IM SUSY
0.3000 mL | PREFILLED_SYRINGE | Freq: Once | INTRAMUSCULAR | 0 refills | Status: AC
  Filled 2022-12-26: qty 0.3, 1d supply, fill #0

## 2023-01-25 DIAGNOSIS — Z23 Encounter for immunization: Secondary | ICD-10-CM | POA: Diagnosis not present

## 2023-02-15 ENCOUNTER — Encounter: Payer: Self-pay | Admitting: Podiatry

## 2023-02-15 ENCOUNTER — Ambulatory Visit: Payer: Medicare HMO | Admitting: Podiatry

## 2023-02-15 DIAGNOSIS — M79674 Pain in right toe(s): Secondary | ICD-10-CM

## 2023-02-15 DIAGNOSIS — Z7901 Long term (current) use of anticoagulants: Secondary | ICD-10-CM

## 2023-02-15 DIAGNOSIS — B351 Tinea unguium: Secondary | ICD-10-CM

## 2023-02-15 DIAGNOSIS — M79675 Pain in left toe(s): Secondary | ICD-10-CM | POA: Diagnosis not present

## 2023-02-15 NOTE — Progress Notes (Signed)
This patient returns to my office for at risk foot care.  This patient requires this care by a professional since this patient will be at risk due to having coagulation defect.  This patient is unable to cut nails himself since the patient cannot reach his nails.These nails are painful walking and wearing shoes. She presents to the office with her daughter. This patient presents for at risk foot care today.  General Appearance  Alert, conversant and in no acute stress.  Vascular  Dorsalis pedis and posterior tibial  pulses are  weakly palpable  bilaterally.  Capillary return is within normal limits  bilaterally. Temperature is within normal limits  bilaterally.  Neurologic  Senn-Weinstein monofilament wire test within normal limits  bilaterally. Muscle power within normal limits bilaterally.  Nails Thick disfigured discolored nails with subungual debris  from hallux to fifth toes bilaterally. No evidence of bacterial infection or drainage bilaterally.  Orthopedic  No limitations of motion  feet .  No crepitus or effusions noted.  No bony pathology or digital deformities noted.  Skin  normotropic skin with no porokeratosis noted bilaterally.  No signs of infections or ulcers noted.     Onychomycosis  Pain in right toes  Pain in left toes  Consent was obtained for treatment procedures.   Mechanical debridement of nails 1-5  bilaterally performed with a nail nipper.  Filed with dremel without incident.    Return office visit  3 months                    Told patient to return for periodic foot care and evaluation due to potential at risk complications.     DPM   

## 2023-05-18 DIAGNOSIS — E785 Hyperlipidemia, unspecified: Secondary | ICD-10-CM | POA: Diagnosis not present

## 2023-05-18 DIAGNOSIS — E041 Nontoxic single thyroid nodule: Secondary | ICD-10-CM | POA: Diagnosis not present

## 2023-05-18 DIAGNOSIS — I10 Essential (primary) hypertension: Secondary | ICD-10-CM | POA: Diagnosis not present

## 2023-05-18 DIAGNOSIS — M810 Age-related osteoporosis without current pathological fracture: Secondary | ICD-10-CM | POA: Diagnosis not present

## 2023-05-23 ENCOUNTER — Ambulatory Visit (HOSPITAL_COMMUNITY)
Admission: RE | Admit: 2023-05-23 | Discharge: 2023-05-23 | Disposition: A | Discharge status: Home / Self Care | Payer: PPO | Source: Ambulatory Visit | Attending: Physician Assistant | Admitting: Physician Assistant

## 2023-05-23 ENCOUNTER — Encounter (HOSPITAL_COMMUNITY): Payer: Self-pay | Admitting: Physician Assistant

## 2023-05-23 VITALS — BP 128/68 | HR 61 | Ht 68.5 in | Wt 137.4 lb

## 2023-05-23 DIAGNOSIS — D6869 Other thrombophilia: Secondary | ICD-10-CM | POA: Diagnosis not present

## 2023-05-23 DIAGNOSIS — I480 Paroxysmal atrial fibrillation: Secondary | ICD-10-CM | POA: Diagnosis not present

## 2023-05-23 DIAGNOSIS — I341 Nonrheumatic mitral (valve) prolapse: Secondary | ICD-10-CM | POA: Diagnosis not present

## 2023-05-23 DIAGNOSIS — Z7901 Long term (current) use of anticoagulants: Secondary | ICD-10-CM | POA: Insufficient documentation

## 2023-05-23 DIAGNOSIS — I4891 Unspecified atrial fibrillation: Secondary | ICD-10-CM | POA: Diagnosis present

## 2023-05-23 DIAGNOSIS — I10 Essential (primary) hypertension: Secondary | ICD-10-CM | POA: Insufficient documentation

## 2023-05-23 LAB — CBC
HCT: 39.8 % (ref 36.0–46.0)
Hemoglobin: 13.1 g/dL (ref 12.0–15.0)
MCH: 32.3 pg (ref 26.0–34.0)
MCHC: 32.9 g/dL (ref 30.0–36.0)
MCV: 98.3 fL (ref 80.0–100.0)
Platelets: 316 10*3/uL (ref 150–400)
RBC: 4.05 MIL/uL (ref 3.87–5.11)
RDW: 12.9 % (ref 11.5–15.5)
WBC: 5.7 10*3/uL (ref 4.0–10.5)
nRBC: 0 % (ref 0.0–0.2)

## 2023-05-23 LAB — BASIC METABOLIC PANEL
Anion gap: 8 (ref 5–15)
BUN: 16 mg/dL (ref 8–23)
CO2: 25 mmol/L (ref 22–32)
Calcium: 9.3 mg/dL (ref 8.9–10.3)
Chloride: 103 mmol/L (ref 98–111)
Creatinine, Ser: 1.18 mg/dL — ABNORMAL HIGH (ref 0.44–1.00)
GFR, Estimated: 42 mL/min — ABNORMAL LOW (ref >60)
Glucose, Bld: 198 mg/dL — ABNORMAL HIGH (ref 70–99)
Potassium: 4.2 mmol/L (ref 3.5–5.1)
Sodium: 136 mmol/L (ref 135–145)

## 2023-05-23 NOTE — Progress Notes (Signed)
Primary Care Physician: Garlan Fillers, MD Primary Cardiologist: Dr Elease Hashimoto Primary Electrophysiologist: Dr Johney Frame (remotely) Referring Physician: [facility] ED   Michelle Gardner is a 89 y.o. female with a history of HTN, MVP, and atrial fibrillation who presents for follow up in the [facility]. The patient was initially diagnosed with atrial fibrillation 12/22/19 after presenting to the ED with symptoms of increased heart rate and BP noted on a home BP machine. ECG showed afib HR 105. She did not have any symptoms associated with her arrhythmia. Patient was started on Eliquis for stroke prevention.   Patient returns for follow up for atrial fibrillation. She reports that she has not had any interim symptoms of afib. No bleeding issues on anticoagulation.   Today, he denies symptoms of palpitations, chest pain, shortness of breath, orthopnea, PND, lower extremity edema, dizziness, presyncope, syncope, snoring, daytime somnolence, bleeding, or neurologic sequela. The patient is tolerating medications without difficulties and is otherwise without complaint today.    Atrial Fibrillation Risk Factors:  she does not have symptoms or diagnosis of sleep apnea. she does not have a history of rheumatic fever. she does not have a history of alcohol use. The patient does have a history of early familial atrial fibrillation or other arrhythmias. Mother and sister had afib.    Atrial Fibrillation Management history:  Previous antiarrhythmic drugs: none Previous cardioversions: none Previous ablations: none Anticoagulation history: Eliquis   Past Medical History:  Diagnosis Date   HTN (hypertension)    Hyperlipidemia    Mitral regurgitation    MVP (mitral valve prolapse)    Near syncope    Peptic ulcer     Current Outpatient Medications  Medication Sig Dispense Refill   acetaminophen (TYLENOL) 500 MG tablet Take 250 mg by mouth 2 (two) times  daily.     amLODipine (NORVASC) 5 MG tablet Take 1 tablet (5 mg total) by mouth daily. 90 tablet 3   CALCIUM PO Take 600 mg by mouth daily.      ELIQUIS 5 MG TABS tablet TAKE ONE TABLET BY MOUTH TWICE A DAY 180 tablet 3   metoprolol succinate (TOPROL-XL) 25 MG 24 hr tablet Take 0.5 tablets (12.5 mg total) by mouth daily as needed (heart racing). 15 tablet 0   potassium chloride (K-DUR,KLOR-CON) 10 MEQ tablet Take 10 mEq by mouth daily.     triamterene-hydrochlorothiazide (MAXZIDE-25) 37.5-25 MG tablet Take 0.5 tablets by mouth daily. 45 tablet 3   No current facility-administered medications for this encounter.    ROS- All systems are reviewed and negative except as per the HPI above.  Physical Exam: Vitals:   05/23/23 1415  BP: 128/68  Pulse: 61  Weight: 62.3 kg  Height: 5' 8.5" (1.74 m)    GEN: Well nourished, well developed in no acute distress CARDIAC: Regular rate and rhythm, no murmurs, rubs, gallops RESPIRATORY:  Clear to auscultation without rales, wheezing or rhonchi  ABDOMEN: Soft, non-tender, non-distended EXTREMITIES:  No edema; No deformity    Wt Readings from Last 3 Encounters:  05/23/23 62.3 kg  12/02/22 62.8 kg  05/16/22 60.9 kg    EKG today demonstrates  SR, 1st degree AV block Vent. rate 61 BPM PR interval 260 ms QRS duration 86 ms QT/QTcB 420/422 ms   Echo 08/25/15 demonstrated  - Left ventricle: The cavity size was normal. Wall thickness was    normal. Systolic function was normal. The estimated ejection    fraction was in  the range of 55% to 60%. Wall motion was normal;    there were no regional wall motion abnormalities. Doppler    parameters are consistent with abnormal left ventricular    relaxation (grade 1 diastolic dysfunction). Doppler parameters    are consistent with high ventricular filling pressure.  - Mitral valve: Mild prolapse, involving the anterior leaflet and    the posterior leaflet. There was mild regurgitation.  - Left  atrium: The atrium was mildly dilated.  - Pulmonary arteries: Systolic pressure was mildly increased. PA    peak pressure: 33 mm Hg (S).   Impressions:   - Normal LV systolic function; grade 1 diastolic dysfunction with    elevated LV filling pressure; mild LAE; bileaflet MVP with mild    MR; mild TR with mildly elevated pulmonary pressure.   Epic records are reviewed at length today  CHA2DS2-VASc Score = 4  The patient's score is based upon: CHF History: 0 HTN History: 1 Diabetes History: 0 Stroke History: 0 Vascular Disease History: 0 Age Score: 2 Gender Score: 1       ASSESSMENT AND PLAN: Paroxysmal Atrial Fibrillation (ICD10:  I48.0) The patient's CHA2DS2-VASc score is 4, indicating a 4.8% annual risk of stroke.   Patient appears to be maintaining SR Continue Eliquis 5 mg BID  Not currently on AV nodal agent given resting bradycardia and nocturnal sinus pauses.  May use metoprolol 12.5 mg PRN for heart racing.   Secondary Hypercoagulable State (ICD10:  D68.69) The patient is at significant risk for stroke/thromboembolism based upon her CHA2DS2-VASc Score of 4.  Continue Apixaban (Eliquis). No bleeding issues.  Check bmet/cbc today.  HTN Stable on current regimen   Follow up with Dr Elease Hashimoto per recall. AF clinic in one year.    Jorja Loa PA-C [facility] [address] [place], [place] [address] [phone] 05/23/2023 2:27 PM

## 2023-05-24 ENCOUNTER — Ambulatory Visit: Payer: PPO | Admitting: Podiatry

## 2023-05-24 ENCOUNTER — Encounter: Payer: Self-pay | Admitting: Podiatry

## 2023-05-24 DIAGNOSIS — B351 Tinea unguium: Secondary | ICD-10-CM

## 2023-05-24 DIAGNOSIS — Z7901 Long term (current) use of anticoagulants: Secondary | ICD-10-CM | POA: Diagnosis not present

## 2023-05-24 DIAGNOSIS — M79674 Pain in right toe(s): Secondary | ICD-10-CM

## 2023-05-24 DIAGNOSIS — M79675 Pain in left toe(s): Secondary | ICD-10-CM | POA: Diagnosis not present

## 2023-05-24 NOTE — Progress Notes (Signed)
This patient returns to my office for at risk foot care.  This patient requires this care by a professional since this patient will be at risk due to having coagulation defect.  This patient is unable to cut nails himself since the patient cannot reach his nails.These nails are painful walking and wearing shoes. She presents to the office with her daughter. This patient presents for at risk foot care today.  General Appearance  Alert, conversant and in no acute stress.  Vascular  Dorsalis pedis and posterior tibial  pulses are  weakly palpable  bilaterally.  Capillary return is within normal limits  bilaterally. Temperature is within normal limits  bilaterally.  Neurologic  Senn-Weinstein monofilament wire test within normal limits  bilaterally. Muscle power within normal limits bilaterally.  Nails Thick disfigured discolored nails with subungual debris  from hallux to fifth toes bilaterally. No evidence of bacterial infection or drainage bilaterally.  Orthopedic  No limitations of motion  feet .  No crepitus or effusions noted.  No bony pathology or digital deformities noted.  Skin  normotropic skin with no porokeratosis noted bilaterally.  No signs of infections or ulcers noted.     Onychomycosis  Pain in right toes  Pain in left toes  Consent was obtained for treatment procedures.   Mechanical debridement of nails 1-5  bilaterally performed with a nail nipper.  Filed with dremel without incident.    Return office visit  3 months                    Told patient to return for periodic foot care and evaluation due to potential at risk complications.   Helane Gunther DPM

## 2023-05-25 DIAGNOSIS — N1831 Chronic kidney disease, stage 3a: Secondary | ICD-10-CM | POA: Diagnosis not present

## 2023-05-25 DIAGNOSIS — R2681 Unsteadiness on feet: Secondary | ICD-10-CM | POA: Diagnosis not present

## 2023-05-25 DIAGNOSIS — D6869 Other thrombophilia: Secondary | ICD-10-CM | POA: Diagnosis not present

## 2023-05-25 DIAGNOSIS — M810 Age-related osteoporosis without current pathological fracture: Secondary | ICD-10-CM | POA: Diagnosis not present

## 2023-05-25 DIAGNOSIS — I129 Hypertensive chronic kidney disease with stage 1 through stage 4 chronic kidney disease, or unspecified chronic kidney disease: Secondary | ICD-10-CM | POA: Diagnosis not present

## 2023-05-25 DIAGNOSIS — Z0000 Encounter for general adult medical examination without abnormal findings: Secondary | ICD-10-CM | POA: Diagnosis not present

## 2023-05-25 DIAGNOSIS — G301 Alzheimer's disease with late onset: Secondary | ICD-10-CM | POA: Diagnosis not present

## 2023-05-25 DIAGNOSIS — R82998 Other abnormal findings in urine: Secondary | ICD-10-CM | POA: Diagnosis not present

## 2023-05-25 DIAGNOSIS — I480 Paroxysmal atrial fibrillation: Secondary | ICD-10-CM | POA: Diagnosis not present

## 2023-05-25 DIAGNOSIS — E785 Hyperlipidemia, unspecified: Secondary | ICD-10-CM | POA: Diagnosis not present

## 2023-06-14 DIAGNOSIS — R269 Unspecified abnormalities of gait and mobility: Secondary | ICD-10-CM | POA: Diagnosis not present

## 2023-06-19 DIAGNOSIS — R269 Unspecified abnormalities of gait and mobility: Secondary | ICD-10-CM | POA: Diagnosis not present

## 2023-06-21 DIAGNOSIS — R269 Unspecified abnormalities of gait and mobility: Secondary | ICD-10-CM | POA: Diagnosis not present

## 2023-06-26 DIAGNOSIS — R269 Unspecified abnormalities of gait and mobility: Secondary | ICD-10-CM | POA: Diagnosis not present

## 2023-06-28 DIAGNOSIS — R269 Unspecified abnormalities of gait and mobility: Secondary | ICD-10-CM | POA: Diagnosis not present

## 2023-07-03 DIAGNOSIS — R269 Unspecified abnormalities of gait and mobility: Secondary | ICD-10-CM | POA: Diagnosis not present

## 2023-07-05 DIAGNOSIS — R269 Unspecified abnormalities of gait and mobility: Secondary | ICD-10-CM | POA: Diagnosis not present

## 2023-07-08 ENCOUNTER — Other Ambulatory Visit (HOSPITAL_COMMUNITY): Payer: Self-pay | Admitting: Physician Assistant

## 2023-07-10 DIAGNOSIS — R269 Unspecified abnormalities of gait and mobility: Secondary | ICD-10-CM | POA: Diagnosis not present

## 2023-07-12 DIAGNOSIS — R269 Unspecified abnormalities of gait and mobility: Secondary | ICD-10-CM | POA: Diagnosis not present

## 2023-07-17 DIAGNOSIS — R269 Unspecified abnormalities of gait and mobility: Secondary | ICD-10-CM | POA: Diagnosis not present

## 2023-07-21 DIAGNOSIS — R269 Unspecified abnormalities of gait and mobility: Secondary | ICD-10-CM | POA: Diagnosis not present

## 2023-07-24 DIAGNOSIS — R269 Unspecified abnormalities of gait and mobility: Secondary | ICD-10-CM | POA: Diagnosis not present

## 2023-07-26 DIAGNOSIS — R269 Unspecified abnormalities of gait and mobility: Secondary | ICD-10-CM | POA: Diagnosis not present

## 2023-07-30 ENCOUNTER — Other Ambulatory Visit: Payer: Self-pay | Admitting: Cardiovascular Disease

## 2023-08-07 DIAGNOSIS — R269 Unspecified abnormalities of gait and mobility: Secondary | ICD-10-CM | POA: Diagnosis not present

## 2023-08-09 DIAGNOSIS — R269 Unspecified abnormalities of gait and mobility: Secondary | ICD-10-CM | POA: Diagnosis not present

## 2023-08-16 DIAGNOSIS — M79605 Pain in left leg: Secondary | ICD-10-CM | POA: Diagnosis not present

## 2023-08-16 DIAGNOSIS — R300 Dysuria: Secondary | ICD-10-CM | POA: Diagnosis not present

## 2023-08-16 DIAGNOSIS — R269 Unspecified abnormalities of gait and mobility: Secondary | ICD-10-CM | POA: Diagnosis not present

## 2023-08-16 DIAGNOSIS — M25552 Pain in left hip: Secondary | ICD-10-CM | POA: Diagnosis not present

## 2023-08-17 DIAGNOSIS — R82998 Other abnormal findings in urine: Secondary | ICD-10-CM | POA: Diagnosis not present

## 2023-08-17 DIAGNOSIS — I10 Essential (primary) hypertension: Secondary | ICD-10-CM | POA: Diagnosis not present

## 2023-08-23 ENCOUNTER — Ambulatory Visit: Payer: PPO | Admitting: Podiatry

## 2023-09-06 DIAGNOSIS — R635 Abnormal weight gain: Secondary | ICD-10-CM | POA: Diagnosis not present

## 2023-09-06 DIAGNOSIS — I480 Paroxysmal atrial fibrillation: Secondary | ICD-10-CM | POA: Diagnosis not present

## 2023-09-06 DIAGNOSIS — M810 Age-related osteoporosis without current pathological fracture: Secondary | ICD-10-CM | POA: Diagnosis not present

## 2023-09-06 DIAGNOSIS — G301 Alzheimer's disease with late onset: Secondary | ICD-10-CM | POA: Diagnosis not present

## 2023-09-06 DIAGNOSIS — S22059A Unspecified fracture of T5-T6 vertebra, initial encounter for closed fracture: Secondary | ICD-10-CM | POA: Diagnosis not present

## 2023-09-06 DIAGNOSIS — N1831 Chronic kidney disease, stage 3a: Secondary | ICD-10-CM | POA: Diagnosis not present

## 2023-09-06 DIAGNOSIS — R2681 Unsteadiness on feet: Secondary | ICD-10-CM | POA: Diagnosis not present

## 2023-09-06 DIAGNOSIS — I129 Hypertensive chronic kidney disease with stage 1 through stage 4 chronic kidney disease, or unspecified chronic kidney disease: Secondary | ICD-10-CM | POA: Diagnosis not present

## 2023-09-06 DIAGNOSIS — R0602 Shortness of breath: Secondary | ICD-10-CM | POA: Diagnosis not present

## 2023-11-07 DIAGNOSIS — R509 Fever, unspecified: Secondary | ICD-10-CM | POA: Diagnosis not present

## 2023-11-07 DIAGNOSIS — R5383 Other fatigue: Secondary | ICD-10-CM | POA: Diagnosis not present

## 2024-01-23 ENCOUNTER — Ambulatory Visit: Admitting: Podiatry

## 2024-01-23 ENCOUNTER — Encounter: Payer: Self-pay | Admitting: Podiatry

## 2024-01-23 DIAGNOSIS — Z7901 Long term (current) use of anticoagulants: Secondary | ICD-10-CM | POA: Diagnosis not present

## 2024-01-23 DIAGNOSIS — M79674 Pain in right toe(s): Secondary | ICD-10-CM | POA: Diagnosis not present

## 2024-01-23 DIAGNOSIS — M79675 Pain in left toe(s): Secondary | ICD-10-CM | POA: Diagnosis not present

## 2024-01-23 DIAGNOSIS — B351 Tinea unguium: Secondary | ICD-10-CM

## 2024-01-23 NOTE — Progress Notes (Signed)
 This patient returns to my office for at risk foot care.  This patient requires this care by a professional since this patient will be at risk due to having coagulation defect.  This patient is unable to cut nails himself since the patient cannot reach his nails.These nails are painful walking and wearing shoes. She presents to the office with her daughter. This patient presents for at risk foot care today.  General Appearance  Alert, conversant and in no acute stress.  Vascular  Dorsalis pedis and posterior tibial  pulses are  weakly palpable  bilaterally.  Capillary return is within normal limits  bilaterally. Temperature is within normal limits  bilaterally.  Neurologic  Senn-Weinstein monofilament wire test within normal limits  bilaterally. Muscle power within normal limits bilaterally.  Nails Thick disfigured discolored nails with subungual debris  from hallux to fifth toes bilaterally. No evidence of bacterial infection or drainage bilaterally.  Orthopedic  No limitations of motion  feet .  No crepitus or effusions noted.  No bony pathology or digital deformities noted.  Skin  normotropic skin with no porokeratosis noted bilaterally.  No signs of infections or ulcers noted.     Onychomycosis  Pain in right toes  Pain in left toes  Consent was obtained for treatment procedures.   Mechanical debridement of nails 1-5  bilaterally performed with a nail nipper.  Filed with dremel without incident.    Return office visit  3 months                    Told patient to return for periodic foot care and evaluation due to potential at risk complications.   Helane Gunther DPM

## 2024-01-27 DIAGNOSIS — Z23 Encounter for immunization: Secondary | ICD-10-CM | POA: Diagnosis not present

## 2024-02-01 DIAGNOSIS — G301 Alzheimer's disease with late onset: Secondary | ICD-10-CM | POA: Diagnosis not present

## 2024-02-01 DIAGNOSIS — I10 Essential (primary) hypertension: Secondary | ICD-10-CM | POA: Diagnosis not present

## 2024-02-01 DIAGNOSIS — M810 Age-related osteoporosis without current pathological fracture: Secondary | ICD-10-CM | POA: Diagnosis not present

## 2024-02-01 DIAGNOSIS — R2681 Unsteadiness on feet: Secondary | ICD-10-CM | POA: Diagnosis not present

## 2024-02-01 DIAGNOSIS — R300 Dysuria: Secondary | ICD-10-CM | POA: Diagnosis not present

## 2024-02-01 DIAGNOSIS — I480 Paroxysmal atrial fibrillation: Secondary | ICD-10-CM | POA: Diagnosis not present

## 2024-02-01 DIAGNOSIS — R531 Weakness: Secondary | ICD-10-CM | POA: Diagnosis not present

## 2024-02-01 DIAGNOSIS — R296 Repeated falls: Secondary | ICD-10-CM | POA: Diagnosis not present

## 2024-02-01 LAB — LAB REPORT - SCANNED: EGFR: 57.9

## 2024-02-02 ENCOUNTER — Ambulatory Visit: Admitting: Cardiovascular Disease

## 2024-02-06 NOTE — Progress Notes (Unsigned)
 Cardiology Office Note    Date:  02/07/2024  ID:  Michelle Gardner, DOB 1924/12/30, MRN [id number] PCP:  Michelle Toribio MATSU, MD  Cardiologist:  Michelle Passe, MD (Inactive)  Electrophysiologist:  None   Chief Complaint: Follow up for atrial fibrillation   History of Present Illness: .   Michelle Gardner is a 89 y.o. female with visit-pertinent history of paroxysmal atrial fibrillation, mitral valve prolapse, hypertension, sinus bradycardia, dizziness, s/p radioactive iodine treatments.  Patient previously followed by Dr. Passe, previously evaluated by Dr. Kelsie for bradycardia and a cardiac monitor that indicated pauses while sleeping in 2017, he did not feel a pacemaker was indicated at that time.  Patient initially diagnosed with atrial fibrillation in 12/2019 after presenting to the ED with symptoms of increased heart rate and atrial fibrillation noted on blood pressure machine.  ECG showed A-fib at 105 bpm, she remained asymptomatic.  She was started on Eliquis  for stroke prevention and has been followed by atrial fibrillation clinic.  She may use metoprolol  to tartrate 12.5 mg as needed for heart racing.  Today she presents for follow-up with her niece who is POA.  Patient reports that she is doing well, denies any chest pain, shortness of breath, lower extremity edema, orthopnea. Patient poor historian, most of history comes from patient's niece.  Her niece notes concerns regarding more frequent falls since May, family has been rotating through living with her as a result.  Deny any episodes of what she has hit her head.  Note that they feel most the time that her falls are related to balance problems or not positioning herself correctly before sitting.  They are not aware of any true syncopal episodes, she denies any dizziness or lightheadedness to family around events.  Labwork independently reviewed: 02/01/2024: Creatinine 0.9, sodium 138, potassium 4.2, AST 15, ALT 12, hemoglobin 12,  hematocrit 36.3 ROS: .   Today she denies chest pain, shortness of breath, lower extremity edema, fatigue, palpitations, melena, hematuria, hemoptysis, diaphoresis, weakness, presyncope, syncope, orthopnea, and PND.  All other systems are reviewed and otherwise negative. Studies Reviewed: Michelle Gardner   EKG:  EKG is ordered today, personally reviewed, demonstrating  EKG Interpretation Date/Time:  Wednesday February 07 2024 13:44:14 EDT Ventricular Rate:  62 PR Interval:  250 QRS Duration:  82 QT Interval:  400 QTC Calculation: 406 R Axis:   -8  Text Interpretation: Sinus rhythm with 1st degree A-V block with Premature atrial complexes Possible Anterior infarct (cited on or before 02-Dec-2022) When compared with ECG of 23-May-2023 14:17, Premature atrial complexes are now Present Confirmed by Terre Zabriskie [phone]) on 02/07/2024 1:50:05 PM   CV Studies: Cardiac studies reviewed are outlined and summarized above. Otherwise please see EMR for full report. Cardiac Studies & Procedures   ______________________________________________________________________________________________     ECHOCARDIOGRAM  ECHOCARDIOGRAM COMPLETE 08/25/2015  Narrative *[facility]* [address] [place], [place] [address] [phone]  ------------------------------------------------------------------- Transthoracic Echocardiography  Patient:    Michelle Gardner, [place] MR #:       [id number] Study Date: 08/25/2015 Gender:     F Age:        19 Height:     172.7 cm Weight:     64.8 kg BSA:        1.76 m^2 Pt. Status: Room:  ATTENDING    Michelle Gardner, M.D. REFERRING    Michelle Gardner, M.D. SONOGRAPHER  Waldo [place], RCS PERFORMING   [facility], Outpatient ORDERING     Michelle Gardner, Michelle Gardner  cc:  ------------------------------------------------------------------- LV EF: 55% -   60%  ------------------------------------------------------------------- Indications:      Syncope  (R55).  ------------------------------------------------------------------- History:   PMH:  Dizziness.  Mitral valve prolapse.  Risk factors: Hypertension.  ------------------------------------------------------------------- Study Conclusions  - Left ventricle: The cavity size was normal. Wall thickness was normal. Systolic function was normal. The estimated ejection fraction was in the range of 55% to 60%. Wall motion was normal; there were no regional wall motion abnormalities. Doppler parameters are consistent with abnormal left ventricular relaxation (grade 1 diastolic dysfunction). Doppler parameters are consistent with high ventricular filling pressure. - Mitral valve: Mild prolapse, involving the anterior leaflet and the posterior leaflet. There was mild regurgitation. - Left atrium: The atrium was mildly dilated. - Pulmonary arteries: Systolic pressure was mildly increased. PA peak pressure: 33 mm Hg (S).  Impressions:  - Normal LV systolic function; grade 1 diastolic dysfunction with elevated LV filling pressure; mild LAE; bileaflet MVP with mild MR; mild TR with mildly elevated pulmonary pressure.  Transthoracic echocardiography.  M-mode, complete 2D, spectral Doppler, and color Doppler.  Birthdate:  Patient birthdate: 1925-03-18.  Age:  Patient is 89 yr old.  Sex:  Gender: female. BMI: 21.7 kg/m^2.  Blood pressure:     164/83  Patient status: Outpatient.  Study date:  Study date: 08/25/2015. Study time: 03:03 PM.  Location:  [facility]  -------------------------------------------------------------------  ------------------------------------------------------------------- Left ventricle:  The cavity size was normal. Wall thickness was normal. Systolic function was normal. The estimated ejection fraction was in the range of 55% to 60%. Wall motion was normal; there were no regional wall motion abnormalities. Doppler parameters are consistent with abnormal  left ventricular relaxation (grade 1 diastolic dysfunction). Doppler parameters are consistent with high ventricular filling pressure.  ------------------------------------------------------------------- Aortic valve:   Trileaflet; mildly calcified leaflets. Cusp separation was normal.  Doppler:  Transvalvular velocity was within the normal range. There was no stenosis. There was no regurgitation.  ------------------------------------------------------------------- Aorta:  Aortic root: The aortic root was normal in size.  ------------------------------------------------------------------- Mitral valve:  Leaflet separation was normal.  Mild prolapse, involving the anterior leaflet and the posterior leaflet.  Doppler: Transvalvular velocity was within the normal range. There was no evidence for stenosis. There was mild regurgitation.  ------------------------------------------------------------------- Left atrium:  The atrium was mildly dilated.  ------------------------------------------------------------------- Right ventricle:  The cavity size was normal. Systolic function was normal.  ------------------------------------------------------------------- Pulmonic valve:    Structurally normal valve.   Cusp separation was normal.  Doppler:  Transvalvular velocity was within the normal range. There was trivial regurgitation.  ------------------------------------------------------------------- Tricuspid valve:   Structurally normal valve.   Leaflet separation was normal.  Doppler:  Transvalvular velocity was within the normal range. There was mild regurgitation.  ------------------------------------------------------------------- Pulmonary artery:   Systolic pressure was mildly increased.  ------------------------------------------------------------------- Right atrium:  The atrium was normal in  size.  ------------------------------------------------------------------- Pericardium:  There was no pericardial effusion.  ------------------------------------------------------------------- Systemic veins: Inferior vena cava: The vessel was normal in size. The respirophasic diameter changes were in the normal range (= 50%), consistent with normal central venous pressure. Diameter: 14.9 mm.  ------------------------------------------------------------------- Measurements  IVC                                         Value        Reference ID  14.9  mm     ---------  Left ventricle                              Value        Reference LV ID, ED, PLAX chordal             (L)     40.6  mm     43 - 52 LV ID, ES, PLAX chordal                     24.6  mm     23 - 38 LV fx shortening, PLAX chordal              39    %      >=29 LV PW thickness, ED                         9.31  mm     --------- IVS/LV PW ratio, ED                         1.19         <=1.3 Stroke volume, 2D                           71    ml     --------- Stroke volume/bsa, 2D                       40    ml/m^2 --------- LV ejection fraction, 1-p A4C               59    %      --------- LV e&', lateral                              3.29  cm/s   --------- LV E/e&', lateral                            17.42        --------- LV e&', medial                               2.74  cm/s   --------- LV E/e&', medial                             20.91        --------- LV e&', average                              3.02  cm/s   --------- LV E/e&', average                            19           ---------  Ventricular septum                          Value        Reference IVS thickness, ED  11.1  mm     ---------  LVOT                                        Value        Reference LVOT ID, S                                  20    mm     --------- LVOT area                                    3.14  cm^2   --------- LVOT peak velocity, S                       109   cm/s   --------- LVOT mean velocity, S                       72.4  cm/s   --------- LVOT VTI, S                                 22.7  cm     ---------  Aorta                                       Value        Reference Aortic root ID, ED                          34    mm     ---------  Left atrium                                 Value        Reference LA ID, A-P, ES                              28    mm     --------- LA ID/bsa, A-P                              1.59  cm/m^2 <=2.2 LA volume, S                                55    ml     --------- LA volume/bsa, S                            31.2  ml/m^2 --------- LA volume, ES, 1-p A4C                      52    ml     --------- LA volume/bsa, ES, 1-p A4C  29.5  ml/m^2 --------- LA volume, ES, 1-p A2C                      58    ml     --------- LA volume/bsa, ES, 1-p A2C                  32.9  ml/m^2 ---------  Mitral valve                                Value        Reference Mitral E-wave peak velocity                 57.3  cm/s   --------- Mitral A-wave peak velocity                 106   cm/s   --------- Mitral deceleration time            (H)     254   ms     150 - 230 Mitral E/A ratio, peak                      0.5          ---------  Pulmonary arteries                          Value        Reference PA pressure, S, DP                  (H)     33    mm Hg  <=30  Tricuspid valve                             Value        Reference Tricuspid regurg peak velocity              274   cm/s   --------- Tricuspid peak RV-RA gradient               30    mm Hg  --------- Tricuspid maximal regurg velocity,          274   cm/s   --------- PISA  Systemic veins                              Value        Reference Estimated CVP                               3     mm Hg  ---------  Right ventricle                             Value         Reference RV pressure, S, DP                  (H)     33    mm Hg  <=30 RV s&', lateral, S                           14.9  cm/s   ---------  Legend: (L)  and  (H)  mark values outside specified reference range.  ------------------------------------------------------------------- Prepared and Electronically Authenticated by  Redell Shallow 2017-05-16T17:58:12    MONITORS  CARDIAC EVENT MONITOR 08/25/2015  Narrative  NSR with frequent episodes of sinus bradycardia  several episodes of prolonged sinus arrest that occur at night       ______________________________________________________________________________________________       Current Reported Medications:.    Current Meds  Medication Sig   acetaminophen (TYLENOL) 500 MG tablet Take 250 mg by mouth 2 (two) times daily.   amLODipine  (NORVASC ) 2.5 MG tablet Take 1 tablet (2.5 mg total) by mouth daily.   CALCIUM PO Take 600 mg by mouth daily.    ELIQUIS  5 MG TABS tablet TAKE 1 TABLET BY MOUTH 2 TIMES A DAY   metoprolol  succinate (TOPROL -XL) 25 MG 24 hr tablet Take 0.5 tablets (12.5 mg total) by mouth daily as needed (heart racing).   potassium chloride  (K-DUR,KLOR-CON ) 10 MEQ tablet Take 10 mEq by mouth daily.   [DISCONTINUED] amLODipine  (NORVASC ) 5 MG tablet TAKE 1 TABLET BY MOUTH DAILY   [DISCONTINUED] triamterene -hydrochlorothiazide (MAXZIDE-25) 37.5-25 MG tablet Take 0.5 tablets by mouth daily.   Physical Exam:    VS:  BP 112/62   Pulse 62   Ht 5' 7 (1.702 m)   Wt 140 lb 12.8 oz (63.9 kg)   SpO2 99%   BMI 22.05 kg/m    Wt Readings from Last 3 Encounters:  02/07/24 140 lb 12.8 oz (63.9 kg)  05/23/23 137 lb 6.4 oz (62.3 kg)  12/02/22 138 lb 6.4 oz (62.8 kg)    GEN: Well nourished, well developed in no acute distress NECK: No JVD; No carotid bruits CARDIAC: RRR, no murmurs, rubs, gallops RESPIRATORY:  Clear to auscultation without rales, wheezing or rhonchi  ABDOMEN: Soft, non-tender,  non-distended EXTREMITIES:  No edema; No acute deformity     Asessement and Plan:.    PAF: Patient with history of paroxysmal atrial fibrillation.  EKG today indicates she is in sinus rhythm with PACs.  Patient family deny reports of increased palpitations.  Patient's niece questioned possibly having her wear a cardiac monitor to see if possible arrhythmias contributing to falls however upon further discussion deferred at this time, patient's niece notes that they would not be interested in pursuing any invasive measures or true changes at this time.  We did discuss possibly needing to discontinue anticoagulation given frequent falls and increased risk for bleeding, she will discuss this with other family members prior to discontinuation.  Reviewed ED precautions and if she falls and there is possible question of syncope or of her hitting her head she needs emergency room evaluation, she verbalized understanding.  Continue Eliquis  5 mg twice daily, to discuss further on follow up.   HTN: Blood pressure initially today 114/62, on recheck was 112/62.  Question if she has an element of orthostasis, when checked orthostatic vitals from sitting to standing today she was not orthostatic however question if slightly blood pressure is contributing to her frequent falls.  Will reduce amlodipine  to 2.5 mg daily, encouraged family to monitor blood pressure at home.   Disposition: F/u with Chestina Komatsu, NP in four weeks.   Signed, Daichi Moris D Jocabed Cheese, NP

## 2024-02-07 ENCOUNTER — Telehealth: Payer: Self-pay | Admitting: Cardiology

## 2024-02-07 ENCOUNTER — Ambulatory Visit: Attending: Cardiology | Admitting: Cardiology

## 2024-02-07 ENCOUNTER — Encounter: Payer: Self-pay | Admitting: Cardiology

## 2024-02-07 VITALS — BP 112/62 | HR 62 | Ht 67.0 in | Wt 140.8 lb

## 2024-02-07 DIAGNOSIS — I480 Paroxysmal atrial fibrillation: Secondary | ICD-10-CM | POA: Diagnosis not present

## 2024-02-07 DIAGNOSIS — D6869 Other thrombophilia: Secondary | ICD-10-CM

## 2024-02-07 DIAGNOSIS — I10 Essential (primary) hypertension: Secondary | ICD-10-CM

## 2024-02-07 MED ORDER — AMLODIPINE BESYLATE 2.5 MG PO TABS
2.5000 mg | ORAL_TABLET | Freq: Every day | ORAL | 3 refills | Status: AC
Start: 2024-02-07 — End: 2024-05-07

## 2024-02-07 MED ORDER — TRIAMTERENE-HCTZ 37.5-25 MG PO TABS: 0.5000 | ORAL_TABLET | Freq: Every day | ORAL | 3 refills | Status: AC

## 2024-02-07 NOTE — Telephone Encounter (Signed)
 Called patient's niece to review lab work from [employer], overall reassuring, would not recommend changes at this time.  Was unable to reach patient's niece, left voicemail with callback number provided.

## 2024-02-07 NOTE — Patient Instructions (Signed)
 Medication Instructions:  Decrease Amlodipine  2.5 mg take one tablet daily *If you need a refill on your cardiac medications before your next appointment, please call your pharmacy*   Follow-Up: At [facility], you and your health needs are our priority.  As part of our continuing mission to provide you with exceptional heart care, our providers are all part of one team.  This team includes your primary Cardiologist (physician) and Advanced Practice Providers or APPs (Physician Assistants and Nurse Practitioners) who all work together to provide you with the care you need, when you need it.  Your next appointment:   3-4 week(s)  Provider:   Katlyn West, NP

## 2024-02-09 DIAGNOSIS — Z604 Social exclusion and rejection: Secondary | ICD-10-CM | POA: Diagnosis not present

## 2024-02-09 DIAGNOSIS — Z7901 Long term (current) use of anticoagulants: Secondary | ICD-10-CM | POA: Diagnosis not present

## 2024-02-09 DIAGNOSIS — M810 Age-related osteoporosis without current pathological fracture: Secondary | ICD-10-CM | POA: Diagnosis not present

## 2024-02-09 DIAGNOSIS — Z556 Problems related to health literacy: Secondary | ICD-10-CM | POA: Diagnosis not present

## 2024-02-09 DIAGNOSIS — F0280 Dementia in other diseases classified elsewhere without behavioral disturbance: Secondary | ICD-10-CM | POA: Diagnosis not present

## 2024-02-09 DIAGNOSIS — I480 Paroxysmal atrial fibrillation: Secondary | ICD-10-CM | POA: Diagnosis not present

## 2024-02-09 DIAGNOSIS — E785 Hyperlipidemia, unspecified: Secondary | ICD-10-CM | POA: Diagnosis not present

## 2024-02-09 DIAGNOSIS — R32 Unspecified urinary incontinence: Secondary | ICD-10-CM | POA: Diagnosis not present

## 2024-02-09 DIAGNOSIS — K219 Gastro-esophageal reflux disease without esophagitis: Secondary | ICD-10-CM | POA: Diagnosis not present

## 2024-02-09 DIAGNOSIS — R296 Repeated falls: Secondary | ICD-10-CM | POA: Diagnosis not present

## 2024-02-09 DIAGNOSIS — I10 Essential (primary) hypertension: Secondary | ICD-10-CM | POA: Diagnosis not present

## 2024-02-09 DIAGNOSIS — G301 Alzheimer's disease with late onset: Secondary | ICD-10-CM | POA: Diagnosis not present

## 2024-02-09 DIAGNOSIS — Z9181 History of falling: Secondary | ICD-10-CM | POA: Diagnosis not present

## 2024-02-21 DIAGNOSIS — R531 Weakness: Secondary | ICD-10-CM | POA: Diagnosis not present

## 2024-02-21 DIAGNOSIS — G301 Alzheimer's disease with late onset: Secondary | ICD-10-CM | POA: Diagnosis not present

## 2024-02-21 DIAGNOSIS — R296 Repeated falls: Secondary | ICD-10-CM | POA: Diagnosis not present

## 2024-02-21 DIAGNOSIS — R262 Difficulty in walking, not elsewhere classified: Secondary | ICD-10-CM | POA: Diagnosis not present

## 2024-02-22 DIAGNOSIS — Z961 Presence of intraocular lens: Secondary | ICD-10-CM | POA: Diagnosis not present

## 2024-02-22 DIAGNOSIS — H02052 Trichiasis without entropian right lower eyelid: Secondary | ICD-10-CM | POA: Diagnosis not present

## 2024-02-22 DIAGNOSIS — H0102A Squamous blepharitis right eye, upper and lower eyelids: Secondary | ICD-10-CM | POA: Diagnosis not present

## 2024-02-22 DIAGNOSIS — H40013 Open angle with borderline findings, low risk, bilateral: Secondary | ICD-10-CM | POA: Diagnosis not present

## 2024-02-22 DIAGNOSIS — H0102B Squamous blepharitis left eye, upper and lower eyelids: Secondary | ICD-10-CM | POA: Diagnosis not present

## 2024-02-22 DIAGNOSIS — H16143 Punctate keratitis, bilateral: Secondary | ICD-10-CM | POA: Diagnosis not present

## 2024-03-04 NOTE — Progress Notes (Signed)
 Cardiology Office Note    Date:  03/12/2024  ID:  Michelle Gardner, DOB Jan 10, 1925, MRN [id number] PCP:  Michelle Toribio MATSU, MD  Cardiologist:  Michelle Passe, MD (Inactive)  Electrophysiologist:  None   Chief Complaint: Follow up for HTN  History of Present Illness: .    Michelle Gardner is a 89 y.o. female with visit-pertinent history of paroxysmal atrial fibrillation, mitral valve prolapse, hypertension, sinus bradycardia, dizziness, s/p radioactive iodine treatments.   Patient previously followed by Dr. Passe, previously evaluated by Dr. Kelsie for bradycardia and a cardiac monitor that indicated pauses while sleeping in 2017, he did not feel a pacemaker was indicated at that time.   Patient initially diagnosed with atrial fibrillation in 12/2019 after presenting to the ED with symptoms of increased heart rate and atrial fibrillation noted on blood pressure machine.  ECG showed A-fib at 105 bpm, she remained asymptomatic.  She was started on Eliquis  for stroke prevention and has been followed by atrial fibrillation clinic.  She may use metoprolol  to tartrate 12.5 mg as needed for heart racing.  Patient was last seen in clinic on 02/07/2024 for follow-up with her niece who is POA.  As noted the patient is a poor historian and most history was presented by her niece.  Patients niece was more concerned regarding more frequent falls since May.  They felt that most of the time her falls are related to balance problems or not positioning herself correctly before sitting.  They were not aware of any true syncopal episodes.  Patient's amlodipine  was reduced to 2.5 mg daily, encouraged patient family to monitor blood pressure at home.  There was also started discussion of possibly needing to discontinue anticoagulation given frequent falls and increased risk for bleeding.  Today she presents for follow-up with her niece who is POA.  She reports that she has been doing well, patient is more interactive  today.  She denies any chest pain, shortness of breath or palpitations.  Patient's niece confirms that she has not reported any symptoms or concerns.  They deny any further falls, her niece notes that she seems significantly improved in the last few weeks, and is more steady on her feet. ROS: .   Today she denies chest pain, shortness of breath, lower extremity edema, fatigue, palpitations, melena, hematuria, hemoptysis, diaphoresis, weakness, presyncope, syncope, orthopnea, and PND.  All other systems are reviewed and otherwise negative. Studies Reviewed: Michelle Gardner   EKG:  EKG is not ordered today.  CV Studies: Cardiac studies reviewed are outlined and summarized above. Otherwise please see EMR for full report. Cardiac Studies & Procedures   ______________________________________________________________________________________________     ECHOCARDIOGRAM  ECHOCARDIOGRAM COMPLETE 08/25/2015  Narrative *[facility]* [address] [place], [place] [address] [phone]  ------------------------------------------------------------------- Transthoracic Echocardiography  Patient:    Michelle Gardner MR #:       [id number] Study Date: 08/25/2015 Gender:     F Age:        27 Height:     172.7 cm Weight:     64.8 kg BSA:        1.76 m^2 Pt. Status: Room:  ATTENDING    Michelle Gardner, M.D. REFERRING    Michelle Gardner, M.D. SONOGRAPHER  Michelle Gardner, RCS PERFORMING   [facility], Outpatient ORDERING     Michelle Gardner  cc:  ------------------------------------------------------------------- LV EF: 55% -   60%  ------------------------------------------------------------------- Indications:      Syncope (R55).  ------------------------------------------------------------------- History:   PMH:  Dizziness.  Mitral valve prolapse.  Risk factors: Hypertension.  ------------------------------------------------------------------- Study Conclusions  - Left ventricle: The cavity size was  normal. Wall thickness was normal. Systolic function was normal. The estimated ejection fraction was in the range of 55% to 60%. Wall motion was normal; there were no regional wall motion abnormalities. Doppler parameters are consistent with abnormal left ventricular relaxation (grade 1 diastolic dysfunction). Doppler parameters are consistent with high ventricular filling pressure. - Mitral valve: Mild prolapse, involving the anterior leaflet and the posterior leaflet. There was mild regurgitation. - Left atrium: The atrium was mildly dilated. - Pulmonary arteries: Systolic pressure was mildly increased. PA peak pressure: 33 mm Hg (S).  Impressions:  - Normal LV systolic function; grade 1 diastolic dysfunction with elevated LV filling pressure; mild LAE; bileaflet MVP with mild MR; mild TR with mildly elevated pulmonary pressure.  Transthoracic echocardiography.  M-mode, complete 2D, spectral Doppler, and color Doppler.  Birthdate:  Patient birthdate: 1924/11/30.  Age:  Patient is 89 yr old.  Sex:  Gender: female. BMI: 21.7 kg/m^2.  Blood pressure:     164/83  Patient status: Outpatient.  Study date:  Study date: 08/25/2015. Study time: 03:03 PM.  Location:  [facility]  -------------------------------------------------------------------  ------------------------------------------------------------------- Left ventricle:  The cavity size was normal. Wall thickness was normal. Systolic function was normal. The estimated ejection fraction was in the range of 55% to 60%. Wall motion was normal; there were no regional wall motion abnormalities. Doppler parameters are consistent with abnormal left ventricular relaxation (grade 1 diastolic dysfunction). Doppler parameters are consistent with high ventricular filling pressure.  ------------------------------------------------------------------- Aortic valve:   Trileaflet; mildly calcified leaflets. Cusp separation was normal.   Doppler:  Transvalvular velocity was within the normal range. There was no stenosis. There was no regurgitation.  ------------------------------------------------------------------- Aorta:  Aortic root: The aortic root was normal in size.  ------------------------------------------------------------------- Mitral valve:  Leaflet separation was normal.  Mild prolapse, involving the anterior leaflet and the posterior leaflet.  Doppler: Transvalvular velocity was within the normal range. There was no evidence for stenosis. There was mild regurgitation.  ------------------------------------------------------------------- Left atrium:  The atrium was mildly dilated.  ------------------------------------------------------------------- Right ventricle:  The cavity size was normal. Systolic function was normal.  ------------------------------------------------------------------- Pulmonic valve:    Structurally normal valve.   Cusp separation was normal.  Doppler:  Transvalvular velocity was within the normal range. There was trivial regurgitation.  ------------------------------------------------------------------- Tricuspid valve:   Structurally normal valve.   Leaflet separation was normal.  Doppler:  Transvalvular velocity was within the normal range. There was mild regurgitation.  ------------------------------------------------------------------- Pulmonary artery:   Systolic pressure was mildly increased.  ------------------------------------------------------------------- Right atrium:  The atrium was normal in size.  ------------------------------------------------------------------- Pericardium:  There was no pericardial effusion.  ------------------------------------------------------------------- Systemic veins: Inferior vena cava: The vessel was normal in size. The respirophasic diameter changes were in the normal range (= 50%), consistent with normal central venous  pressure. Diameter: 14.9 mm.  ------------------------------------------------------------------- Measurements  IVC                                         Value        Reference ID  14.9  mm     ---------  Left ventricle                              Value        Reference LV ID, ED, PLAX chordal             (L)     40.6  mm     43 - 52 LV ID, ES, PLAX chordal                     24.6  mm     23 - 38 LV fx shortening, PLAX chordal              39    %      >=29 LV PW thickness, ED                         9.31  mm     --------- IVS/LV PW ratio, ED                         1.19         <=1.3 Stroke volume, 2D                           71    ml     --------- Stroke volume/bsa, 2D                       40    ml/m^2 --------- LV ejection fraction, 1-p A4C               59    %      --------- LV e&', lateral                              3.29  cm/s   --------- LV E/e&', lateral                            17.42        --------- LV e&', medial                               2.74  cm/s   --------- LV E/e&', medial                             20.91        --------- LV e&', average                              3.02  cm/s   --------- LV E/e&', average                            19           ---------  Ventricular septum                          Value        Reference IVS thickness, ED  11.1  mm     ---------  LVOT                                        Value        Reference LVOT ID, S                                  20    mm     --------- LVOT area                                   3.14  cm^2   --------- LVOT peak velocity, S                       109   cm/s   --------- LVOT mean velocity, S                       72.4  cm/s   --------- LVOT VTI, S                                 22.7  cm     ---------  Aorta                                       Value        Reference Aortic root ID, ED                          34    mm      ---------  Left atrium                                 Value        Reference LA ID, A-P, ES                              28    mm     --------- LA ID/bsa, A-P                              1.59  cm/m^2 <=2.2 LA volume, S                                55    ml     --------- LA volume/bsa, S                            31.2  ml/m^2 --------- LA volume, ES, 1-p A4C                      52    ml     --------- LA volume/bsa, ES, 1-p A4C  29.5  ml/m^2 --------- LA volume, ES, 1-p A2C                      58    ml     --------- LA volume/bsa, ES, 1-p A2C                  32.9  ml/m^2 ---------  Mitral valve                                Value        Reference Mitral E-wave peak velocity                 57.3  cm/s   --------- Mitral A-wave peak velocity                 106   cm/s   --------- Mitral deceleration time            (H)     254   ms     150 - 230 Mitral E/A ratio, peak                      0.5          ---------  Pulmonary arteries                          Value        Reference PA pressure, S, DP                  (H)     33    mm Hg  <=30  Tricuspid valve                             Value        Reference Tricuspid regurg peak velocity              274   cm/s   --------- Tricuspid peak RV-RA gradient               30    mm Hg  --------- Tricuspid maximal regurg velocity,          274   cm/s   --------- PISA  Systemic veins                              Value        Reference Estimated CVP                               3     mm Hg  ---------  Right ventricle                             Value        Reference RV pressure, S, DP                  (H)     33    mm Hg  <=30 RV s&', lateral, S                           14.9  cm/s   ---------  Legend: (L)  and  (H)  mark values outside specified reference range.  ------------------------------------------------------------------- Prepared and Electronically Authenticated by  Redell Shallow 2017-05-16T17:58:12     MONITORS  CARDIAC EVENT MONITOR 08/25/2015  Narrative  NSR with frequent episodes of sinus bradycardia  several episodes of prolonged sinus arrest that occur at night       ______________________________________________________________________________________________       Current Reported Medications:.    Current Meds  Medication Sig   acetaminophen (TYLENOL) 500 MG tablet Take 250 mg by mouth 2 (two) times daily.   amLODipine  (NORVASC ) 2.5 MG tablet Take 1 tablet (2.5 mg total) by mouth daily.   CALCIUM PO Take 600 mg by mouth daily.    docusate sodium (COLACE) 100 MG capsule Take 100 mg by mouth daily.   ELIQUIS  5 MG TABS tablet TAKE 1 TABLET BY MOUTH 2 TIMES A DAY   metoprolol  succinate (TOPROL -XL) 25 MG 24 hr tablet Take 0.5 tablets (12.5 mg total) by mouth daily as needed (heart racing).   potassium chloride  (K-DUR,KLOR-CON ) 10 MEQ tablet Take 10 mEq by mouth daily.   triamterene -hydrochlorothiazide (MAXZIDE-25) 37.5-25 MG tablet Take 0.5 tablets by mouth daily.   Physical Exam:    VS:  BP (!) 142/74   Pulse (!) 59   Ht 5' 7 (1.702 m)   Wt 142 lb (64.4 kg)   SpO2 98%   BMI 22.24 kg/m    Wt Readings from Last 3 Encounters:  03/12/24 142 lb (64.4 kg)  02/07/24 140 lb 12.8 oz (63.9 kg)  05/23/23 137 lb 6.4 oz (62.3 kg)    GEN: Well nourished, well developed in no acute distress NECK: No JVD; No carotid bruits CARDIAC: RRR, no murmurs, rubs, gallops RESPIRATORY:  Clear to auscultation without rales, wheezing or rhonchi  ABDOMEN: Soft, non-tender, non-distended EXTREMITIES:  No edema; No acute deformity     Asessement and Plan:.    PAF: Patient with history of paroxysmal atrial fibrillation, maintaining sinus rhythm on auscultation today.  Patient and family deny any increased palpitations.  Patient's niece has previously noted they would not be interested in pursuing any invasive measures.  They deny any bleeding problems on Eliquis .  They deny any further  falls with decreased dose of amlodipine , they note she has been more steady on her feet.  Given improvement will not discontinue Eliquis  at this time, patient notes that they will continue to monitor and notify the office of any changes.  She will follow-up with A-fib clinic in February as scheduled.  Continue Eliquis  5 mg twice daily.  HTN: Blood pressure today 142/74, her niece notes that her blood pressure has been overall well-controlled at home, she does note some occasional fluctuations with systolics ranging between 120 and 140, with one outlier of 170.  Given her age realistic blood pressure goal of 140/90.  They note with decreased dose of amlodipine  she seems steady on her feet and has not had any further falls.  Will continue amlodipine  2.5 mg daily and Maxide.  Encouraged to continue monitoring blood pressures at home.   Disposition: F/u with afib clinic in 05/2024 as scheduled, Dr. Francyne in six months to establish care.   Signed, Ailey Wessling D Emmert Roethler, NP

## 2024-03-05 ENCOUNTER — Other Ambulatory Visit (HOSPITAL_BASED_OUTPATIENT_CLINIC_OR_DEPARTMENT_OTHER): Payer: Self-pay

## 2024-03-05 MED ORDER — COMIRNATY 30 MCG/0.3ML IM SUSY
0.3000 mL | PREFILLED_SYRINGE | Freq: Once | INTRAMUSCULAR | 0 refills | Status: AC
  Filled 2024-03-05: qty 0.3, 1d supply, fill #0

## 2024-03-12 ENCOUNTER — Encounter: Payer: Self-pay | Admitting: Cardiology

## 2024-03-12 ENCOUNTER — Ambulatory Visit: Attending: Cardiology | Admitting: Cardiology

## 2024-03-12 VITALS — BP 142/74 | HR 59 | Ht 67.0 in | Wt 142.0 lb

## 2024-03-12 DIAGNOSIS — I10 Essential (primary) hypertension: Secondary | ICD-10-CM

## 2024-03-12 DIAGNOSIS — I480 Paroxysmal atrial fibrillation: Secondary | ICD-10-CM

## 2024-03-12 DIAGNOSIS — D6869 Other thrombophilia: Secondary | ICD-10-CM | POA: Diagnosis not present

## 2024-03-12 NOTE — Patient Instructions (Signed)
 Medication Instructions:  Your physician recommends that you continue on your current medications as directed. Please refer to the Current Medication list given to you today.  *If you need a refill on your cardiac medications before your next appointment, please call your pharmacy*  Lab Work: NONE If you have labs (blood work) drawn today and your tests are completely normal, you will receive your results only by: MyChart Message (if you have MyChart) OR A paper copy in the mail If you have any lab test that is abnormal or we need to change your treatment, we will call you to review the results.  Testing/Procedures: NONE  Follow-Up: At [facility], you and your health needs are our priority.  As part of our continuing mission to provide you with exceptional heart care, our providers are all part of one team.  This team includes your primary Cardiologist (physician) and Advanced Practice Providers or APPs (Physician Assistants and Nurse Practitioners) who all work together to provide you with the care you need, when you need it.  Your next appointment:   5-6 month(s)  Provider:   Dr. Francyne

## 2024-03-22 DIAGNOSIS — R296 Repeated falls: Secondary | ICD-10-CM | POA: Diagnosis not present

## 2024-03-22 DIAGNOSIS — G301 Alzheimer's disease with late onset: Secondary | ICD-10-CM | POA: Diagnosis not present

## 2024-03-22 DIAGNOSIS — R531 Weakness: Secondary | ICD-10-CM | POA: Diagnosis not present

## 2024-05-27 ENCOUNTER — Ambulatory Visit (HOSPITAL_COMMUNITY): Admitting: Physician Assistant

## 2024-05-28 ENCOUNTER — Ambulatory Visit: Admitting: Podiatry
